# Patient Record
Sex: Female | Born: 1980 | Race: White | Hispanic: No | Marital: Married | Smoking: Never smoker
Health system: Southern US, Community
[De-identification: ages and names within clinical notes are randomized; demographics above are authoritative.]

## PROBLEM LIST (undated history)

## (undated) DIAGNOSIS — G4730 Sleep apnea, unspecified: Secondary | ICD-10-CM

## (undated) DIAGNOSIS — D649 Anemia, unspecified: Secondary | ICD-10-CM

## (undated) DIAGNOSIS — C801 Malignant (primary) neoplasm, unspecified: Secondary | ICD-10-CM

## (undated) DIAGNOSIS — G709 Myoneural disorder, unspecified: Secondary | ICD-10-CM

## (undated) DIAGNOSIS — T7840XA Allergy, unspecified, initial encounter: Secondary | ICD-10-CM

## (undated) DIAGNOSIS — I10 Essential (primary) hypertension: Secondary | ICD-10-CM

## (undated) DIAGNOSIS — M1990 Unspecified osteoarthritis, unspecified site: Secondary | ICD-10-CM

## (undated) DIAGNOSIS — G919 Hydrocephalus, unspecified: Secondary | ICD-10-CM

## (undated) DIAGNOSIS — N83209 Unspecified ovarian cyst, unspecified side: Secondary | ICD-10-CM

## (undated) HISTORY — DX: Sleep apnea, unspecified: G47.30

## (undated) HISTORY — PX: KNEE SURGERY: SHX244

## (undated) HISTORY — DX: Hydrocephalus, unspecified: G91.9

## (undated) HISTORY — PX: BRAIN SURGERY: SHX531

## (undated) HISTORY — DX: Unspecified osteoarthritis, unspecified site: M19.90

## (undated) HISTORY — DX: Myoneural disorder, unspecified: G70.9

## (undated) HISTORY — DX: Malignant (primary) neoplasm, unspecified: C80.1

## (undated) HISTORY — PX: BUNIONECTOMY: SHX129

## (undated) HISTORY — DX: Anemia, unspecified: D64.9

## (undated) HISTORY — DX: Allergy, unspecified, initial encounter: T78.40XA

## (undated) HISTORY — DX: Unspecified ovarian cyst, unspecified side: N83.209

## (undated) HISTORY — DX: Essential (primary) hypertension: I10

---

## 2007-04-05 ENCOUNTER — Encounter: Admission: RE | Admit: 2007-04-05 | Discharge: 2007-04-05 | Payer: Self-pay | Admitting: Neurosurgery

## 2007-05-26 ENCOUNTER — Other Ambulatory Visit: Admission: RE | Admit: 2007-05-26 | Discharge: 2007-05-26 | Payer: Self-pay | Admitting: Obstetrics and Gynecology

## 2010-05-09 ENCOUNTER — Other Ambulatory Visit: Payer: Self-pay | Admitting: Neurosurgery

## 2010-05-09 DIAGNOSIS — R51 Headache: Secondary | ICD-10-CM

## 2010-05-15 ENCOUNTER — Other Ambulatory Visit: Payer: Self-pay | Admitting: Neurosurgery

## 2010-05-15 ENCOUNTER — Ambulatory Visit
Admission: RE | Admit: 2010-05-15 | Discharge: 2010-05-15 | Disposition: A | Discharge status: Home / Self Care | Payer: Managed Care, Other (non HMO) | Source: Ambulatory Visit | Attending: Neurosurgery | Admitting: Neurosurgery

## 2010-05-15 DIAGNOSIS — R531 Weakness: Secondary | ICD-10-CM

## 2010-05-15 DIAGNOSIS — R51 Headache: Secondary | ICD-10-CM

## 2010-05-19 ENCOUNTER — Ambulatory Visit
Admission: RE | Admit: 2010-05-19 | Discharge: 2010-05-19 | Disposition: A | Discharge status: Home / Self Care | Payer: Managed Care, Other (non HMO) | Source: Ambulatory Visit | Attending: Neurosurgery | Admitting: Neurosurgery

## 2010-05-19 DIAGNOSIS — R531 Weakness: Secondary | ICD-10-CM

## 2012-09-06 ENCOUNTER — Other Ambulatory Visit (HOSPITAL_COMMUNITY): Payer: Self-pay | Admitting: Gynecology

## 2012-09-06 DIAGNOSIS — Z3141 Encounter for fertility testing: Secondary | ICD-10-CM

## 2012-09-13 ENCOUNTER — Ambulatory Visit (HOSPITAL_COMMUNITY)
Admission: RE | Admit: 2012-09-13 | Discharge: 2012-09-13 | Disposition: A | Discharge status: Home / Self Care | Payer: BC Managed Care – PPO | Source: Ambulatory Visit | Attending: Gynecology | Admitting: Gynecology

## 2012-09-13 DIAGNOSIS — N979 Female infertility, unspecified: Secondary | ICD-10-CM | POA: Insufficient documentation

## 2012-09-13 DIAGNOSIS — Z3141 Encounter for fertility testing: Secondary | ICD-10-CM

## 2012-09-13 MED ORDER — IOHEXOL 300 MG/ML  SOLN
10.0000 mL | Freq: Once | INTRAMUSCULAR | Status: AC | PRN
  Administered 2012-09-13: 10 mL

## 2020-06-17 ENCOUNTER — Ambulatory Visit (INDEPENDENT_AMBULATORY_CARE_PROVIDER_SITE_OTHER): Payer: No Typology Code available for payment source | Admitting: Nurse Practitioner

## 2020-06-17 ENCOUNTER — Telehealth: Payer: Self-pay | Admitting: *Deleted

## 2020-06-17 ENCOUNTER — Encounter: Payer: Self-pay | Admitting: Nurse Practitioner

## 2020-06-17 ENCOUNTER — Other Ambulatory Visit: Payer: Self-pay

## 2020-06-17 VITALS — BP 118/80 | Ht 65.0 in | Wt 187.0 lb

## 2020-06-17 DIAGNOSIS — M2550 Pain in unspecified joint: Secondary | ICD-10-CM

## 2020-06-17 DIAGNOSIS — R232 Flushing: Secondary | ICD-10-CM

## 2020-06-17 DIAGNOSIS — N949 Unspecified condition associated with female genital organs and menstrual cycle: Secondary | ICD-10-CM

## 2020-06-17 DIAGNOSIS — Z01419 Encounter for gynecological examination (general) (routine) without abnormal findings: Secondary | ICD-10-CM | POA: Diagnosis not present

## 2020-06-17 DIAGNOSIS — Z9889 Other specified postprocedural states: Secondary | ICD-10-CM | POA: Diagnosis not present

## 2020-06-17 DIAGNOSIS — R5383 Other fatigue: Secondary | ICD-10-CM

## 2020-06-17 NOTE — Patient Instructions (Addendum)
Schedule mammogram! [facility] [phone] [address]  [place], [place] [address]   Health Maintenance, Female Adopting a healthy lifestyle and getting preventive care are important in promoting health and wellness. Ask your health care provider about:  The right schedule for you to have regular tests and exams.  Things you can do on your own to prevent diseases and keep yourself healthy. What should I know about diet, weight, and exercise? Eat a healthy diet  Eat a diet that includes plenty of vegetables, fruits, low-fat dairy products, and lean protein.  Do not eat a lot of foods that are high in solid fats, added sugars, or sodium.   Maintain a healthy weight Body mass index (BMI) is used to identify weight problems. It estimates body fat based on height and weight. Your health care provider can help determine your BMI and help you achieve or maintain a healthy weight. Get regular exercise Get regular exercise. This is one of the most important things you can do for your health. Most adults should:  Exercise for at least 150 minutes each week. The exercise should increase your heart rate and make you sweat (moderate-intensity exercise).  Do strengthening exercises at least twice a week. This is in addition to the moderate-intensity exercise.  Spend less time sitting. Even light physical activity can be beneficial. Watch cholesterol and blood lipids Have your blood tested for lipids and cholesterol at 42 years of age, then have this test every 5 years. Have your cholesterol levels checked more often if:  Your lipid or cholesterol levels are high.  You are older than 42 years of age.  You are at high risk for heart disease. What should I know about cancer screening? Depending on your health history and family history, you may need to have cancer screening at various ages. This may include screening for:  Breast cancer.  Cervical  cancer.  Colorectal cancer.  Skin cancer.  Lung cancer. What should I know about heart disease, diabetes, and high blood pressure? Blood pressure and heart disease  High blood pressure causes heart disease and increases the risk of stroke. This is more likely to develop in people who have high blood pressure readings, are of African descent, or are overweight.  Have your blood pressure checked: ? Every 3-5 years if you are 18-39 years of age. ? Every year if you are 40 years old or older. Diabetes Have regular diabetes screenings. This checks your fasting blood sugar level. Have the screening done:  Once every three years after age 40 if you are at a normal weight and have a low risk for diabetes.  More often and at a younger age if you are overweight or have a high risk for diabetes. What should I know about preventing infection? Hepatitis B If you have a higher risk for hepatitis B, you should be screened for this virus. Talk with your health care provider to find out if you are at risk for hepatitis B infection. Hepatitis C Testing is recommended for:  Everyone born from 1945 through 1965.  Anyone with known risk factors for hepatitis C. Sexually transmitted infections (STIs)  Get screened for STIs, including gonorrhea and chlamydia, if: ? You are sexually active and are younger than 42 years of age. ? You are older than 42 years of age and your health care provider tells you that you are at risk for this type of infection. ? Your sexual activity has changed   since you were last screened, and you are at increased risk for chlamydia or gonorrhea. Ask your health care provider if you are at risk.  Ask your health care provider about whether you are at high risk for HIV. Your health care provider may recommend a prescription medicine to help prevent HIV infection. If you choose to take medicine to prevent HIV, you should first get tested for HIV. You should then be tested every 3  months for as long as you are taking the medicine. Pregnancy  If you are about to stop having your period (premenopausal) and you may become pregnant, seek counseling before you get pregnant.  Take 400 to 800 micrograms (mcg) of folic acid every day if you become pregnant.  Ask for birth control (contraception) if you want to prevent pregnancy. Osteoporosis and menopause Osteoporosis is a disease in which the bones lose minerals and strength with aging. This can result in bone fractures. If you are 65 years old or older, or if you are at risk for osteoporosis and fractures, ask your health care provider if you should:  Be screened for bone loss.  Take a calcium or vitamin D supplement to lower your risk of fractures.  Be given hormone replacement therapy (HRT) to treat symptoms of menopause. Follow these instructions at home: Lifestyle  Do not use any products that contain nicotine or tobacco, such as cigarettes, e-cigarettes, and chewing tobacco. If you need help quitting, ask your health care provider.  Do not use street drugs.  Do not share needles.  Ask your health care provider for help if you need support or information about quitting drugs. Alcohol use  Do not drink alcohol if: ? Your health care provider tells you not to drink. ? You are pregnant, may be pregnant, or are planning to become pregnant.  If you drink alcohol: ? Limit how much you use to 0-1 drink a day. ? Limit intake if you are breastfeeding.  Be aware of how much alcohol is in your drink. In the [place] one drink equals one 12 oz bottle of beer (355 mL), one 5 oz glass of wine (148 mL), or one 1 oz glass of hard liquor (44 mL). General instructions  Schedule regular health, dental, and eye exams.  Stay current with your vaccines.  Tell your health care provider if: ? You often feel depressed. ? You have ever been abused or do not feel safe at home. Summary  Adopting a healthy lifestyle and getting  preventive care are important in promoting health and wellness.  Follow your health care provider's instructions about healthy diet, exercising, and getting tested or screened for diseases.  Follow your health care provider's instructions on monitoring your cholesterol and blood pressure. This information is not intended to replace advice given to you by your health care provider. Make sure you discuss any questions you have with your health care provider. Document Revised: 03/02/2018 Document Reviewed: 03/02/2018 Elsevier Patient Education  2021 Elsevier Inc.  

## 2020-06-17 NOTE — Telephone Encounter (Signed)
-----   Message from Olivia Mackie, NP sent at 06/17/2020 10:36 AM EDT ----- Please send rheumatology referral for evaluation of facial flushing, joint pain, and chronic fatigue. She was seen by rheumatology in the past for evaluation of fibromyalgia and Lupus and needs a new one. Thank you.

## 2020-06-17 NOTE — Telephone Encounter (Signed)
Referral placed at [facility] health Rheumatology they will call patient to schedule.

## 2020-06-17 NOTE — Progress Notes (Signed)
Kimberly Rice 09-05-1980 [id number]   History:  40 y.o. G0 presents as new patient to establish care with multiple complaints. She complains of vaginal bump that she noticed a few days ago - no redness, minimal pain, and reports some white drainage with expression. Amenorrheic/endometrial ablation - would like FSH checked as she has been having facial flushing since she was diagnosed with covid months ago. She was seen by rheumatology in the past for possible lupus and fibromyalgia and she would like referral. Father with agent orange exposure.  Large ovarian cysts in the past but unable to complete surgery because of inability to visualize ovaries due to uterus adhering to anterior and posterior walls. History of infertility. Normal pap history.   Gynecologic History No LMP recorded (lmp unknown). Patient has had an ablation.   Contraception/Family planning: none  Health Maintenance Last Pap: 2 years ago per patient. Results were: normal Last mammogram: Never Last colonoscopy: 2 years ago per patient Last Dexa: Never  Past medical history, past surgical history, family history and social history were all reviewed and documented in the EPIC chart.  ROS:  A ROS was performed and pertinent positives and negatives are included.  Exam:  Vitals:   06/17/20 0841  BP: 118/80  Weight: 187 lb (84.8 kg)  Height: 5\' 5"  (1.651 m)   Body mass index is 31.12 kg/m.  General appearance:  Normal Thyroid:  Symmetrical, normal in size, without palpable masses or nodularity. Respiratory  Auscultation:  Clear without wheezing or rhonchi Cardiovascular  Auscultation:  Regular rate, without rubs, murmurs or gallops  Edema/varicosities:  Not grossly evident Abdominal  Soft,nontender, without masses, guarding or rebound.  Liver/spleen:  No organomegaly noted  Hernia:  None appreciated  Skin  Inspection:  Grossly normal   Breasts: Examined lying and sitting.   Right: Without masses,  retractions, discharge or axillary adenopathy.   Left: Without masses, retractions, discharge or axillary adenopathy. Gentitourinary   Inguinal/mons:  Normal without inguinal adenopathy  External genitalia:  2 cm deep cyst, non-infectious appearing, no drainage  BUS/Urethra/Skene's glands:  Normal  Vagina:  Normal  Cervix:  Normal  Uterus:  Normal in size, shape and contour.  Midline and mobile  Adnexa/parametria:     Rt: Without masses or tenderness.   Lt: Without masses or tenderness.  Anus and perineum: Normal  Assessment/Plan:  40 y.o. G0 to establish care.   Well female exam with routine gynecological exam - Education provided on SBEs, importance of preventative screenings, current guidelines, high calcium diet, regular exercise, and multivitamin daily.   Vaginal lump - 2 cm cyst felt at left posterior fourchette. No redness, drainage, pain with palpation, or drainage present. We agree to monitor and if no improvement or if area becomes larger or infectious-appearing she will return for incision and drainage. Recommend warm compresses and avoiding friction, squeezing, or rubbing.   History of endometrial ablation - amenorrheic  Fatigue, unspecified type - complains of chronic fatigue. Was evaluated for fibromyalgia and lupus in the past but needs new rheumatologist.   Arthralgia, unspecified joint - complains of generalized joint pain that she describes as aching. This is not new for her and was seeing rheumatology in the past for evaluation. We will send referral.   Facial flushing - Plan: Follicle stimulating hormone. Has had facial flushing, R>L that is constant and started after covid diagnosis months ago. She would like FSH checked today to rule out menopause. Symptoms characteristic of lupus rash.  Screening for cervical  cancer - Normal Pap history. Pap today.   Screening for breast cancer - Has not had screening mammogram. Discussed current guidelines and importance of  preventative screenings. Information provided on the [facility].  Normal breast exam today.  Screening for colon cancer - Colonoscopy around 2 years ago. Will repeat at GI's recommended interval.   Return in 1 year for annual.    Olivia Mackie DNP, 9:12 AM 06/17/2020

## 2020-06-18 LAB — FOLLICLE STIMULATING HORMONE: FSH: 3.9 m[IU]/mL

## 2020-06-18 NOTE — Telephone Encounter (Signed)
Megan from Rheumatology office sent referral message stating "Hi- prior to reviewing the referral we will need all previous rheumatology records. Thanks!"  I called and informed patient.

## 2020-06-25 NOTE — Telephone Encounter (Signed)
I called patient to see if she has received those records, I am not able to get the records for her.

## 2020-06-26 ENCOUNTER — Encounter: Payer: Self-pay | Admitting: Nurse Practitioner

## 2020-06-26 LAB — PAP, TP IMAGING W/ HPV RNA, RFLX HPV TYPE 16,18/45: HPV DNA High Risk: DETECTED — AB

## 2020-06-26 LAB — HPV TYPE 16 AND 18/45 RNA
HPV Type 16 RNA: NOT DETECTED
HPV Type 18/45 RNA: NOT DETECTED

## 2020-06-27 NOTE — Telephone Encounter (Signed)
Patient informed with below, I provided her with name to [facility] health Rheumatology [phone] so records can be released to them.

## 2020-07-30 NOTE — Telephone Encounter (Signed)
Referral has been closed by Rheumatology patient has not sent records from previous MD.

## 2020-10-03 ENCOUNTER — Other Ambulatory Visit: Payer: Self-pay | Admitting: Rheumatology

## 2020-10-03 DIAGNOSIS — M549 Dorsalgia, unspecified: Secondary | ICD-10-CM

## 2020-10-14 ENCOUNTER — Ambulatory Visit (INDEPENDENT_AMBULATORY_CARE_PROVIDER_SITE_OTHER): Payer: No Typology Code available for payment source | Admitting: Allergy and Immunology

## 2020-10-14 ENCOUNTER — Other Ambulatory Visit: Payer: Self-pay

## 2020-10-14 ENCOUNTER — Encounter: Payer: Self-pay | Admitting: Allergy and Immunology

## 2020-10-14 VITALS — BP 166/98 | HR 77 | Resp 16 | Ht 65.0 in | Wt 196.2 lb

## 2020-10-14 DIAGNOSIS — R232 Flushing: Secondary | ICD-10-CM | POA: Diagnosis not present

## 2020-10-14 DIAGNOSIS — J4520 Mild intermittent asthma, uncomplicated: Secondary | ICD-10-CM

## 2020-10-14 DIAGNOSIS — K219 Gastro-esophageal reflux disease without esophagitis: Secondary | ICD-10-CM | POA: Diagnosis not present

## 2020-10-14 DIAGNOSIS — J3089 Other allergic rhinitis: Secondary | ICD-10-CM | POA: Diagnosis not present

## 2020-10-14 DIAGNOSIS — T781XXD Other adverse food reactions, not elsewhere classified, subsequent encounter: Secondary | ICD-10-CM

## 2020-10-14 MED ORDER — LORATADINE 10 MG PO TABS: ORAL_TABLET | ORAL | 5 refills | Status: DC

## 2020-10-14 MED ORDER — EPINEPHRINE 0.3 MG/0.3ML IJ SOAJ: 0.3000 mg | INTRAMUSCULAR | 1 refills | Status: DC | PRN

## 2020-10-14 MED ORDER — FAMOTIDINE 40 MG PO TABS: 40.0000 mg | ORAL_TABLET | Freq: Every day | ORAL | 5 refills | Status: DC

## 2020-10-14 NOTE — Patient Instructions (Addendum)
  1.  Allergen avoidance measures - dust mite  2.  Daily antihistamine (H1RB): Loratadine 10 mg - 1-2 tablets 1 time per day  3.  Daily antihistamine (H2RB): Famotidine 40 mg - 1 tablet 1 time per day  4. If needed:  A. Albuterol HFA - 2 inhalations every 4-6 hours B. Auvi-Q 0.3, benadryl, MD/ER evaluation for allergic reaction  5. Evaluation for flushing:  A. Blood - Tryptase B. 24 hour urine - methyl histamine, 5-HIAA  6. Review food blood tests  7. Return to clinic in 4 weeks or earlier if problem

## 2020-10-14 NOTE — Progress Notes (Signed)
[place] - [place] - [place] - [place] - [place]   Dear Dr. Mathis Bud,  Thank you for referring Kimberly Rice to the [facility] [place] on 10/14/2020.   Below is a summation of this patient's evaluation and recommendations.  Thank you for your referral. I will keep you informed about this patient's response to treatment.   If you have any questions please do not hesitate to contact me.   Sincerely,  Jessica Priest, MD Allergy / Immunology [facility]   ______________________________________________________________________    NEW PATIENT NOTE  Referring Provider: Lucianne Lei, MD Primary Provider: Lucianne Lei, MD Date of office visit: 10/14/2020    Subjective:   Chief Complaint:  Kimberly Rice (DOB: [birth year]) is a 40 y.o. female who presents to the clinic on 10/14/2020 with a chief complaint of Allergic Reaction and Allergic Rhinitis  .     HPI: Shabre presents to this clinic in evaluation of multiple issues.  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  This appears to occur multiple times per day.  She has very big flushing when she eats spicy foods such as black pepper and white pepper and may be paprika.  Heat exposure also appears to precipitate this issue.This issues been going on for decades but has been much worse recently.  Apparently she has had some blood test performed which identified multiple food sensitivities.  Second, apparently she believes that she may be allergic to eggs.  She was on a keto diet and was eating omelettes on a daily basis in 2020 she started to develop this chest stabbing / breathing problem when she discontinued her egg consumption and it took 2 months for this to resolve.  Then she ate an omelette and she had the exact same symptoms return within minutes which lasted a few days.  Since that  point in time she has not eaten any omelettes but does eat baked goods with eggs with without any problem.  She can eat duck eggs with no problem.  Third, she has "cold symptoms" with coughing and nasal congestion and some slight sneezing and some occasional wheezing and she has been given an inhaler in the past.  This appears to present itself during the spring and the winter.  There does not appear to be an obvious provoking factor giving rise to that issue.  Fourth, she has noticed that she has developed significant problems with reflux over the past several months.  She did have a history of reflux in the past but it was relatively inactive for several years.  She has burning and regurgitation.  She does not consume any caffeine or eat any chocolate.  Fifth, she has been having arthralgia and visited with a rheumatologist in [place] for this issue and had a bunch of blood test performed which apparently did not identify any connective tissue disease but she is being evaluated for a spondyloarthropathy and will be having an MRI of her spine this week.  Sixth, she has rather significant fatigue which has been a problem for decades.  She does use a CPAP machine on a nightly basis.  Her fatigue has been much worse since COVID.  She has had an episode of COVID in February 2022 and June 2022.  Past Medical History:  Diagnosis Date   Hydrocephalus (HCC)    Ovarian cyst  Past Surgical History:  Procedure Laterality Date   BUNIONECTOMY     ENDOMETRIAL ABLATION W/ NOVASURE  06/07/2017   KNEE SURGERY     LEFT OOPHORECTOMY  11/24/2016   Cyst    Allergies as of 10/14/2020       Reactions   Wound Dressing Adhesive         Medication List   None  Review of systems negative except as noted in HPI / PMHx or noted below:  Review of Systems  Constitutional: Negative.   HENT: Negative.    Eyes: Negative.   Respiratory: Negative.    Cardiovascular: Negative.   Gastrointestinal:  Negative.   Genitourinary: Negative.   Musculoskeletal: Negative.   Skin: Negative.   Neurological: Negative.   Endo/Heme/Allergies: Negative.   Psychiatric/Behavioral: Negative.     Family History  Problem Relation Age of Onset   Diabetes Mother    Hypertension Mother    Prostate cancer Father    Heart attack Maternal Grandfather     Social History   Socioeconomic History   Marital status: Married    Spouse name: Not on file   Number of children: Not on file   Years of education: Not on file   Highest education level: Not on file  Occupational History   Not on file  Tobacco Use   Smoking status: Never   Smokeless tobacco: Never  Substance and Sexual Activity   Alcohol use: Never   Drug use: Never   Sexual activity: Yes    Birth control/protection: None  Other Topics Concern   Not on file  Social History Narrative   Not on file   Environmental and Social history  Lives in a house with a dry environment, no animals located inside the household, no carpet in the bedroom, no plastic on the bed, no plastic on the pillow, and no smoking ongoing with inside the household.  Objective:   Vitals:   10/14/20 0930 10/14/20 1001  BP: (!) 170/120 (!) 166/98  Pulse: 77   Resp: 16   SpO2: 98%    Height: 5\' 5"  (165.1 cm) Weight: 196 lb 3.2 oz (89 kg)  Physical Exam Constitutional:      Appearance: She is not diaphoretic.  HENT:     Head: Normocephalic.     Right Ear: Tympanic membrane, ear canal and external ear normal.     Left Ear: Tympanic membrane, ear canal and external ear normal.     Nose: Nose normal. No mucosal edema or rhinorrhea.     Mouth/Throat:     Pharynx: Uvula midline. No oropharyngeal exudate.  Eyes:     Conjunctiva/sclera: Conjunctivae normal.  Neck:     Thyroid: No thyromegaly.     Trachea: Trachea normal. No tracheal tenderness or tracheal deviation.  Cardiovascular:     Rate and Rhythm: Normal rate and regular rhythm.     Heart sounds:  Normal heart sounds, S1 normal and S2 normal. No murmur heard. Pulmonary:     Effort: No respiratory distress.     Breath sounds: Normal breath sounds. No stridor. No wheezing or rales.  Lymphadenopathy:     Head:     Right side of head: No tonsillar adenopathy.     Left side of head: No tonsillar adenopathy.     Cervical: No cervical adenopathy.  Skin:    Findings: Rash (Slight erythematous hue face) present. No erythema.     Nails: There is no clubbing.  Neurological:     Mental  Status: She is alert.    Diagnostics: Allergy skin tests were performed.  She demonstrated hypersensitivity to house dust mite  Spirometry was performed and demonstrated an FEV1 of 2.89 @ 94 % of predicted. FEV1/FVC = 0.87  Assessment and Plan:    1. Flushing   2. Perennial allergic rhinitis   3. Asthma, mild intermittent, well-controlled   4. Adverse food reaction, subsequent encounter   5. Gastroesophageal reflux disease, unspecified whether esophagitis present     1.  Allergen avoidance measures -dust mite  2.  Daily antihistamine (H1RB): Loratadine 10 mg - 1-2 tablets 1 time per day  3.  Daily antihistamine (H2RB): Famotidine 40 mg - 1 tablet 1 time per day  4. If needed:  A. Albuterol HFA - 2 inhalations every 4-6 hours B. Auvi-Q 0.3, benadryl, MD/ER evaluation for allergic reaction  5. Evaluation for flushing:  A. Blood - Tryptase B. 24 hour urine - methyl histamine, 5-HIAA  6. Review food blood tests  7. Return to clinic in 4 weeks or earlier if problem  Dhrithi has some form of Flushing disorder with unknown etiologic agents, and we are going to rule out mastocytosis and carcinoid contributing to this issue.  I suspect that she has autonomic dysfunction that is giving rise to most of her flushing disorder Rinda could also be a component of rosacea.  We will treat her with an H1 and H2 receptor blocker to see if we can blunt some of this reaction.  She has some degree of atopic disease  giving rise to some respiratory tract issues and she can perform dust mite avoidance measures.  She may or may not have true food sensitivity but based on today skin test she did not demonstrate any hypersensitivity against a broad spectrum of foods including egg.  It appears that the blood test that identified sensitivity to foods were all IgG blood tests and not IgE blood tests.  I will see her back in this clinic in 4 weeks or earlier if there is a problem.  Jessica Priest, MD Allergy / Immunology [facility] [place]

## 2020-10-15 ENCOUNTER — Telehealth: Payer: Self-pay | Admitting: Allergy and Immunology

## 2020-10-15 ENCOUNTER — Encounter: Payer: Self-pay | Admitting: *Deleted

## 2020-10-15 ENCOUNTER — Encounter: Payer: Self-pay | Admitting: Allergy and Immunology

## 2020-10-15 MED ORDER — ALBUTEROL SULFATE HFA 108 (90 BASE) MCG/ACT IN AERS: INHALATION_SPRAY | RESPIRATORY_TRACT | 1 refills | Status: DC

## 2020-10-15 NOTE — Telephone Encounter (Signed)
Patient states she forgot to tell Dr. Lucie Leather at her visit yesterday (7/25) that she also experiences tingling and numbness around her mouth and lips. This happens randomly but multiple times a day, as well as in her hands and feet.   She is requesting Albuterol to be sent in to her pharmacy.   [facility] [address]  [place], [place] [address]

## 2020-10-15 NOTE — Telephone Encounter (Signed)
Dr. Lucie Leather, please see additional information from patient in previous message.  Note:  Albuterol HFA has been sent to [facility] as requested and I left detailed message on voicemail informing patient that it has been sent.

## 2020-10-16 NOTE — Telephone Encounter (Signed)
Left message to call the office.  Per Dr. Lucie Leather, please disregard his statement about nebulized albuterol.  There was a miscommunication among the staff; patient already has Albuterol HFA.  Please inform patient of the rest of the message from Dr. Lucie Leather.

## 2020-10-16 NOTE — Telephone Encounter (Signed)
Please provide Kimberly Rice with nebulized albuterol should it be required.  Usually tingling around the mouth and face and occasionally hands is associated with hyperventilation syndrome which means that she is overbreathing and losing too much carbon dioxide as she breathes.  Make sure that she discusses this issue when she returns to this clinic for RV.

## 2020-10-17 ENCOUNTER — Ambulatory Visit
Admission: RE | Admit: 2020-10-17 | Discharge: 2020-10-17 | Disposition: A | Discharge status: Home / Self Care | Payer: PRIVATE HEALTH INSURANCE | Source: Ambulatory Visit | Attending: Rheumatology | Admitting: Rheumatology

## 2020-10-17 ENCOUNTER — Other Ambulatory Visit: Payer: Self-pay

## 2020-10-17 DIAGNOSIS — M549 Dorsalgia, unspecified: Secondary | ICD-10-CM

## 2020-10-17 NOTE — Telephone Encounter (Signed)
Odis informed of Dr. Kathyrn Lass message. She will discuss at her OV.

## 2020-10-22 LAB — HISTAMINE, 24 HOUR URINE
Histamine,ug/24hr,U: 407 ug/24 hr — ABNORMAL HIGH (ref 0–65)
Histamine,ug/L,U: 166 ug/L

## 2020-10-22 LAB — TRYPTASE: Tryptase: 3.9 ug/L (ref 2.2–13.2)

## 2020-10-23 ENCOUNTER — Telehealth: Payer: Self-pay | Admitting: Allergy and Immunology

## 2020-10-23 NOTE — Telephone Encounter (Signed)
Spoke with Kimberly Rice

## 2020-10-23 NOTE — Telephone Encounter (Signed)
Patient returning call on lab work [phone]

## 2020-10-24 LAB — 5 HIAA, QUANTITATIVE, URINE, 24 HOUR
5-HIAA, Ur: 2.8 mg/L
5-HIAA,Quant.,24 Hr Urine: 6.9 mg/24 hr (ref 0.0–14.9)

## 2020-10-30 NOTE — Telephone Encounter (Signed)
Patient called the office regarding the rest of her lab results from 7/28. Informed patient that a nurse will be in touch with her as soon as Dr. Lucie Leather reviews the labs.

## 2020-10-31 NOTE — Telephone Encounter (Signed)
Mychart message has been sent.  

## 2020-11-06 ENCOUNTER — Ambulatory Visit: Payer: No Typology Code available for payment source | Admitting: Allergy and Immunology

## 2020-11-06 ENCOUNTER — Encounter: Payer: Self-pay | Admitting: Allergy and Immunology

## 2020-11-06 ENCOUNTER — Other Ambulatory Visit: Payer: Self-pay

## 2020-11-06 VITALS — BP 148/84 | HR 66 | Resp 20

## 2020-11-06 DIAGNOSIS — G629 Polyneuropathy, unspecified: Secondary | ICD-10-CM

## 2020-11-06 DIAGNOSIS — D8940 Mast cell activation, unspecified: Secondary | ICD-10-CM

## 2020-11-06 DIAGNOSIS — R232 Flushing: Secondary | ICD-10-CM

## 2020-11-06 DIAGNOSIS — J3089 Other allergic rhinitis: Secondary | ICD-10-CM

## 2020-11-06 DIAGNOSIS — F959 Tic disorder, unspecified: Secondary | ICD-10-CM

## 2020-11-06 DIAGNOSIS — J4520 Mild intermittent asthma, uncomplicated: Secondary | ICD-10-CM

## 2020-11-06 MED ORDER — FAMOTIDINE 40 MG PO TABS: 40.0000 mg | ORAL_TABLET | Freq: Two times a day (BID) | ORAL | 5 refills | Status: DC

## 2020-11-06 MED ORDER — LORATADINE 10 MG PO TABS: ORAL_TABLET | ORAL | 5 refills | Status: DC

## 2020-11-06 NOTE — Progress Notes (Signed)
[place] - [place] - [place] - [place] - [place]   Follow-up Note  Referring Provider: Lucianne Lei, MD Primary Provider: Lucianne Lei, MD Date of Office Visit: 11/06/2020  Subjective:   Kimberly Rice (DOB: Aug 08, 1980) is a 40 y.o. female who [facility]2020 in re-evaluation of the following:  HPI: Kimberly Rice returns to this clinic in evaluation of flushing, allergic rhinitis, asthma, adverse food reaction, and reflux.  I last saw Kimberly Rice in this clinic on 14 October 2020 for Kimberly Rice initial evaluation at which point in time we addressed these issues.  Kimberly Rice has significantly improved regarding Kimberly Rice flushing.  Kimberly Rice face is not as warm and Kimberly Rice is not having these big episodes of outbreaks on Kimberly Rice face and chest.  Kimberly Rice has been using Kimberly Rice loratadine at 20 mg daily and Kimberly Rice famotidine at 40 mg daily.  Kimberly Rice reflux is actually doing a lot better at this point in time.  Kimberly Rice is had very little issues with Kimberly Rice airway.  Kimberly Rice does not use a short acting bronchodilator.  Kimberly Rice is eating eggs with no problem.  Kimberly Rice did visit with a rheumatologist regarding possible spondyloarthropathy and Kimberly Rice was told that Kimberly Rice does not have that issue and Kimberly Rice has 2 bulging disks in Kimberly Rice back.  Kimberly Rice has a few other complaints.  Kimberly Rice has been having this tic disorder for a while and this has become much more severe involving Kimberly Rice upper arms.  As well, Kimberly Rice arms feel very fatigued and they ache at night.  And Kimberly Rice has had a long history of having Kimberly Rice lower extremities from Kimberly Rice knees on down through Kimberly Rice feet just feeling very heavy and achy.  And Kimberly Rice has been having some tingling or "fuzzy" feeling of Kimberly Rice lips which has been a relatively new issue for the past several months.  Allergies as of 11/06/2020       Reactions   Wound Dressing Adhesive         Medication List    albuterol 108 (90 Base) MCG/ACT inhaler Commonly known as: VENTOLIN HFA Can inhale two puffs every four to six hours as  needed for cough or wheeze.   EPINEPHrine 0.3 mg/0.3 mL Soaj injection Commonly known as: Auvi-Q Inject 0.3 mg into the muscle as needed for anaphylaxis. As directed for life-threatening allergic reactions   famotidine 40 MG tablet Commonly known as: PEPCID Take 1 tablet (40 mg total) by mouth daily.   loratadine 10 MG tablet Commonly known as: CLARITIN Take 1-2 tablets 1 time per day    Past Medical History:  Diagnosis Date   Hydrocephalus (HCC)    Ovarian cyst     Past Surgical History:  Procedure Laterality Date   BUNIONECTOMY     ENDOMETRIAL ABLATION W/ NOVASURE  06/07/2017   KNEE SURGERY     LEFT OOPHORECTOMY  11/24/2016   Cyst    Review of systems negative except as noted in HPI / PMHx or noted below:  Review of Systems  Constitutional: Negative.   HENT: Negative.    Eyes: Negative.   Respiratory: Negative.    Cardiovascular: Negative.   Gastrointestinal: Negative.   Genitourinary: Negative.   Musculoskeletal: Negative.   Skin: Negative.   Neurological: Negative.   Endo/Heme/Allergies: Negative.   Psychiatric/Behavioral: Negative.      Objective:   Vitals:   11/06/20 1515  BP: (!) 148/84  Pulse: 66  Resp: 20  SpO2: 99%  Physical Exam Constitutional:      Appearance: Kimberly Rice is not diaphoretic.  HENT:     Head: Normocephalic.     Right Ear: Tympanic membrane, ear canal and external ear normal.     Left Ear: Tympanic membrane, ear canal and external ear normal.     Nose: Nose normal. No mucosal edema or rhinorrhea.     Mouth/Throat:     Pharynx: Uvula midline. No oropharyngeal exudate.  Eyes:     Conjunctiva/sclera: Conjunctivae normal.  Neck:     Thyroid: No thyromegaly.     Trachea: Trachea normal. No tracheal tenderness or tracheal deviation.  Cardiovascular:     Rate and Rhythm: Normal rate and regular rhythm.     Heart sounds: Normal heart sounds, S1 normal and S2 normal. No murmur heard. Pulmonary:     Effort: No respiratory  distress.     Breath sounds: Normal breath sounds. No stridor. No wheezing or rales.  Lymphadenopathy:     Head:     Right side of head: No tonsillar adenopathy.     Left side of head: No tonsillar adenopathy.     Cervical: No cervical adenopathy.  Skin:    Findings: No erythema or rash.     Nails: There is no clubbing.  Neurological:     Mental Status: Kimberly Rice is alert.    Diagnostics:   Results of blood test obtained 17 October 2020 identified tryptase 3.9 UG/L.  Results of 24-hour urine test obtained 17 October 2020 identified histamine 407 mcg / 24 hours, 5-HIAA 6.9 mg / 24 hours  Assessment and Plan:   1. Mast cell activation (HCC)   2. Flushing   3. Perennial allergic rhinitis   4. Asthma, mild intermittent, well-controlled   5. Neuropathy   6. Tic disorder     1.  Allergen avoidance measures - dust mite  2.  Daily antihistamine (H1RB): Loratadine 10 mg - 2 tablet 2 times per day  3.  Daily antihistamine (H2RB): Famotidine 40 mg - 1 tablet 2 times per day  4. If needed:  A. Albuterol HFA - 2 inhalations every 4-6 hours B. Auvi-Q 0.3, benadryl, MD/ER evaluation for allergic reaction  5. Visit with neurologist for neuropathy / Tic disorder  6. Return to clinic in December 2022 or earlier if problem  Kimberly Rice does indeed appear to have some form of mast cell activation with increased histamine production as documented by Kimberly Rice 24-hour urinary histamine collection.  At this point in time I do not think Kimberly Rice needs to undergo any further evaluation for mastocytosis as its not really going to change Kimberly Rice approach at this point in time.  Kimberly Rice will utilize a collection of H1 receptor blocker and H2 receptor blocker which has helped Kimberly Rice significantly and we will see what happens as we increase the dose of these medications.  If for some reason Kimberly Rice does demonstrate evidence of progressive mast cell activation or dysregulation then we will probably need to obtain a bone marrow to exclude  systemic mastocytosis.  Kimberly Rice other issues are doing relatively well especially Kimberly Rice respiratory tract issue.  Kimberly Rice does appear to have a tic disorder and a possible neuropathy and we will refer Kimberly Rice onto neurology for further evaluation of this issue.  [name], MD Allergy / Immunology [facility]

## 2020-11-06 NOTE — Patient Instructions (Addendum)
  1.  Allergen avoidance measures - dust mite  2.  Daily antihistamine (H1RB): Loratadine 10 mg - 2 tablet 2 times per day  3.  Daily antihistamine (H2RB): Famotidine 40 mg - 1 tablet 2 times per day  4. If needed:  A. Albuterol HFA - 2 inhalations every 4-6 hours B. Auvi-Q 0.3, benadryl, MD/ER evaluation for allergic reaction  5. Visit with neurologist for neuropathy / Tic disorder  6. Return to clinic in December 2022 or earlier if problem

## 2020-11-07 ENCOUNTER — Encounter: Payer: Self-pay | Admitting: Allergy and Immunology

## 2020-11-13 ENCOUNTER — Ambulatory Visit: Payer: No Typology Code available for payment source | Admitting: Allergy and Immunology

## 2020-12-23 ENCOUNTER — Other Ambulatory Visit: Payer: Self-pay

## 2020-12-23 ENCOUNTER — Ambulatory Visit (INDEPENDENT_AMBULATORY_CARE_PROVIDER_SITE_OTHER): Payer: No Typology Code available for payment source | Admitting: Allergy and Immunology

## 2020-12-23 ENCOUNTER — Encounter: Payer: Self-pay | Admitting: Allergy and Immunology

## 2020-12-23 VITALS — BP 158/100 | HR 92 | Resp 18

## 2020-12-23 DIAGNOSIS — R232 Flushing: Secondary | ICD-10-CM

## 2020-12-23 DIAGNOSIS — J4520 Mild intermittent asthma, uncomplicated: Secondary | ICD-10-CM

## 2020-12-23 DIAGNOSIS — J3089 Other allergic rhinitis: Secondary | ICD-10-CM | POA: Diagnosis not present

## 2020-12-23 DIAGNOSIS — D8940 Mast cell activation, unspecified: Secondary | ICD-10-CM | POA: Diagnosis not present

## 2020-12-23 DIAGNOSIS — K219 Gastro-esophageal reflux disease without esophagitis: Secondary | ICD-10-CM

## 2020-12-23 MED ORDER — ASPIRIN EC 325 MG PO TBEC: 325.0000 mg | DELAYED_RELEASE_TABLET | Freq: Every day | ORAL | 2 refills | Status: DC

## 2020-12-23 MED ORDER — METRONIDAZOLE 0.75 % EX CREA: TOPICAL_CREAM | CUTANEOUS | 3 refills | Status: DC

## 2020-12-23 NOTE — Patient Instructions (Addendum)
  1.  Allergen avoidance measures - dust mite  2.  Daily antihistamine (H1RB): Loratadine 10 mg - 2 tablet 2 times per day  3.  Daily antihistamine (H2RB): Famotidine 40 mg - 1 tablet 2 times per day  4. Start ASA 325 - 1 tablet 1 time per day. Call clinic in 2 weeks  5. Start Metrocream - apply to face 2 times per day  6. If needed:  A. Albuterol HFA - 2 inhalations every 4-6 hours B. Auvi-Q 0.3, benadryl, MD/ER evaluation for allergic reaction C. Nasal steroid -Rhinocort/Nasacort/Flonase   7. Return to clinic in December 2022 or earlier if problem

## 2020-12-23 NOTE — Progress Notes (Signed)
[place] - [place] - [place] - [place] - [place]   Follow-up Note  Referring Provider: Lucianne Lei, MD Primary Provider: Lucianne Lei, MD Date of Office Visit: 12/23/2020  Subjective:   Kimberly Rice (DOB: September 03, 1980) is a 40 y.o. female who [facility] 12/23/2020 in re-evaluation of the following:  HPI: Kimberly Rice returns to this clinic in evaluation of mast cell activation syndrome defined by a elevated urinary histamine level and a flushing disorder, history of asthma, history of allergic rhinitis..  I last saw her in this clinic on 06 November 2020.  Although she is doing better with a combination of an H1 and H2 receptor blocker regarding her flushing of her face and body she still continues to have this issue.  She has been exploring the Internet and feels as though she may require ketotifen.  She was given some hydroxyzine by her primary care doctor and maybe this helped somewhat.  Her reflux is not a problem at this point.  She has had no issues with her airway but occasionally she does get nasal congestion with sometimes can interfere with her treatment for CPAP.Marland Kitchen  She did have a tic disorder when last seen in this clinic and she went to see a neurologist who gave her Topamax but it caused side effects.  She still continues with her tic disorder.  As well, she is having some pain emanating from her right jaw that goes across her mandible and maxilla on occasion for which she saw a dentist who performed x-rays and did not see any significant abnormality and she is going to visit with a neurologist in [place] when she returns to that state in the next month or so.  She does not receive the flu vaccine.  Allergies as of 12/23/2020       Reactions   Wound Dressing Adhesive         Medication List    albuterol 108 (90 Base) MCG/ACT inhaler Commonly known as: VENTOLIN HFA Can inhale two puffs every four to six hours as needed for cough or  wheeze.   EPINEPHrine 0.3 mg/0.3 mL Soaj injection Commonly known as: Auvi-Q Inject 0.3 mg into the muscle as needed for anaphylaxis. As directed for life-threatening allergic reactions   famotidine 40 MG tablet Commonly known as: PEPCID Take 1 tablet (40 mg total) by mouth 2 (two) times daily.   hydrOXYzine 25 MG tablet Commonly known as: ATARAX/VISTARIL Take 25 mg by mouth daily.   loratadine 10 MG tablet Commonly known as: CLARITIN Take 2 tablets twice a day    Past Medical History:  Diagnosis Date   Hydrocephalus (HCC)    Ovarian cyst     Past Surgical History:  Procedure Laterality Date   BUNIONECTOMY     ENDOMETRIAL ABLATION W/ NOVASURE  06/07/2017   KNEE SURGERY     LEFT OOPHORECTOMY  11/24/2016   Cyst    Review of systems negative except as noted in HPI / PMHx or noted below:  Review of Systems  Constitutional: Negative.   HENT: Negative.    Eyes: Negative.   Respiratory: Negative.    Cardiovascular: Negative.   Gastrointestinal: Negative.   Genitourinary: Negative.   Musculoskeletal: Negative.   Skin: Negative.   Neurological: Negative.   Endo/Heme/Allergies: Negative.   Psychiatric/Behavioral: Negative.      Objective:   Vitals:   12/23/20 1113  BP: (!) 158/100  Pulse: 92  Resp: 18  SpO2: 98%  Physical Exam Constitutional:      Appearance: She is not diaphoretic.  HENT:     Head: Normocephalic.     Right Ear: Tympanic membrane, ear canal and external ear normal.     Left Ear: Tympanic membrane, ear canal and external ear normal.     Nose: Nose normal. No mucosal edema or rhinorrhea.     Mouth/Throat:     Pharynx: Uvula midline. No oropharyngeal exudate.  Eyes:     Conjunctiva/sclera: Conjunctivae normal.  Neck:     Thyroid: No thyromegaly.     Trachea: Trachea normal. No tracheal tenderness or tracheal deviation.  Cardiovascular:     Rate and Rhythm: Normal rate and regular rhythm.     Heart sounds: Normal heart sounds,  S1 normal and S2 normal. No murmur heard. Pulmonary:     Effort: No respiratory distress.     Breath sounds: Normal breath sounds. No stridor. No wheezing or rales.  Lymphadenopathy:     Head:     Right side of head: No tonsillar adenopathy.     Left side of head: No tonsillar adenopathy.     Cervical: No cervical adenopathy.  Skin:    Findings: Rash (Facial erythema mostly cheeks central forehead) present. No erythema.     Nails: There is no clubbing.  Neurological:     Mental Status: She is alert.    Diagnostics: none  Assessment and Plan:   1. Mast cell activation (HCC)   2. Flushing   3. Perennial allergic rhinitis   4. Asthma, mild intermittent, well-controlled   5. Gastroesophageal reflux disease, unspecified whether esophagitis present     1.  Allergen avoidance measures - dust mite  2.  Daily antihistamine (H1RB): Loratadine 10 mg - 2 tablet 2 times per day  3.  Daily antihistamine (H2RB): Famotidine 40 mg - 1 tablet 2 times per day  4. Start ASA 325 - 1 tablet 1 time per day. Call clinic in 2 weeks  5. Start Metrocream - apply to face 2 times per day  6. If needed:  A. Albuterol HFA - 2 inhalations every 4-6 hours B. Auvi-Q 0.3, benadryl, MD/ER evaluation for allergic reaction C. Nasal steroid -Rhinocort/Nasacort/Flonase   7. Return to clinic in December 2022 or earlier if problem  Kimberly Rice will start aspirin in an attempt to decrease prostaglandin production from her mast cells and will remain on an H1 and H2 receptor blocker and we will see what type of effect we get this approach regarding her mast cell activating syndrome.  If she does not have a good response to this approach then we will try to blocker leukotrienes with a leukotrienes modifier.  I have given her some MetroCream to a apply on her face as she does appear to have some significant inflammation of the blood vessels of her face.  She will continue on all other therapy as noted above.  I will see her  back in this clinic in December 2022 or earlier if there is a problem.  Kimberly Schimke, MD Allergy / Immunology [facility]

## 2020-12-24 ENCOUNTER — Encounter: Payer: Self-pay | Admitting: Allergy and Immunology

## 2021-01-06 ENCOUNTER — Telehealth: Payer: Self-pay | Admitting: Allergy and Immunology

## 2021-01-06 NOTE — Telephone Encounter (Signed)
Patient called the office to give a 2 week follow up as she's been taking aspirin and using the metrocream. She states she stopped using the cream because it would make her face hurt. She is still having issues everyday and is breaking out in hives every morning. Patient states when she started her period, her left eye started to hurt and swell.

## 2021-01-07 MED ORDER — MONTELUKAST SODIUM 10 MG PO TABS: 10.0000 mg | ORAL_TABLET | Freq: Every day | ORAL | 5 refills | Status: DC

## 2021-01-07 NOTE — Telephone Encounter (Signed)
Inform Kimberly Rice that foremost she should not ever touch or rub her eye.  She can use hot or cold compresses if needed.  She should use lubricating eyedrops such as Systane several times per day.  If her eye remains swollen she will certainly require further evaluation.  Does she have a ophthalmologist or optometrist that she could see this week if her eye continues to be a problem?

## 2021-01-07 NOTE — Telephone Encounter (Signed)
Patient was informed and verbalized understanding.

## 2021-01-07 NOTE — Telephone Encounter (Signed)
Lets have her stop the aspirin and start montelukast 10 mg daily.

## 2021-01-07 NOTE — Telephone Encounter (Signed)
Prescription for montelukast was sent in to pharmacy and patient was informed.  Patient states her left eye is still swollen and it hurts. She would like to know if there is anything we could send in for her like an eye drop. Please advice. Thank you

## 2021-01-21 ENCOUNTER — Telehealth: Payer: Self-pay | Admitting: Allergy and Immunology

## 2021-01-21 NOTE — Telephone Encounter (Signed)
Please advise 

## 2021-01-21 NOTE — Telephone Encounter (Signed)
Patient states none of her medications are working. She's been intermittent fasting but she's still taking her medications 2 times a day. She has a rash 24/7 and states when she flares up her face gets VERY HOT as if its on fire. She is still experiencing the numbness and tingling in her hands. When she eats she is also developing stomach pain.

## 2021-01-22 ENCOUNTER — Other Ambulatory Visit: Payer: Self-pay | Admitting: *Deleted

## 2021-01-22 DIAGNOSIS — R232 Flushing: Secondary | ICD-10-CM

## 2021-01-22 DIAGNOSIS — D8940 Mast cell activation, unspecified: Secondary | ICD-10-CM

## 2021-01-22 MED ORDER — HYDROXYZINE HCL 10 MG PO TABS: ORAL_TABLET | ORAL | 0 refills | Status: DC

## 2021-01-22 NOTE — Telephone Encounter (Signed)
RX sent to pharmacy  

## 2021-01-22 NOTE — Telephone Encounter (Signed)
Left a message for patient to call back. We will just need to see which location she prefers, [facility] or [place]. Also, we will let her know the reason for the referral is because these facilities are able to do more extensive testing and prescribing than we can.

## 2021-01-22 NOTE — Telephone Encounter (Signed)
Spoke with Kimberly Rice and will send a referral to [facility].   She is requesting a prescription for Hydroxyzine to help hold her over until her appt. She states that she does not take it everyday, only at night when she cannot sleep because of her symptoms.

## 2022-06-08 ENCOUNTER — Ambulatory Visit (INDEPENDENT_AMBULATORY_CARE_PROVIDER_SITE_OTHER): Payer: No Typology Code available for payment source | Admitting: Nurse Practitioner

## 2022-06-08 ENCOUNTER — Encounter: Payer: Self-pay | Admitting: Nurse Practitioner

## 2022-06-08 VITALS — BP 136/82 | HR 87 | Ht 62.0 in | Wt 204.0 lb

## 2022-06-08 DIAGNOSIS — N644 Mastodynia: Secondary | ICD-10-CM | POA: Diagnosis not present

## 2022-06-08 NOTE — Progress Notes (Addendum)
   Acute Office Visit  Subjective:    Patient ID: Kimberly Rice, female    DOB: [birth year], 42 y.o.   MRN: [id number]   HPI 42 y.o. presents today for left breast pain x 1 month. Pain is bilateral, shooting, intermittent, L>R, in outer quadrants. Pain is worse with certain movements, like leaning or lying on her side. Denies redness, mass, nipple drainage, or skin changes. Does not recall any injury. Has never had screening mammogram. Requesting annual exam during visit but made aware that this is a problem visit and was scheduled accordingly by patient.    Review of Systems  Constitutional: Negative.   Hematological:  Negative for adenopathy.  Right breast: Positive for pain. Negative for nipple discharge, redness, swelling, lumps, or skin changes Left breast: Positive for pain. Negative for nipple discharge, redness, swelling, lumps, or skin changes     Objective:    Physical Exam Constitutional:      Appearance: Normal appearance.  Chest:  Breasts:    Right: Normal.     Left: Tenderness present. No swelling, bleeding, inverted nipple, mass, nipple discharge or skin change.    Lymphadenopathy:     Upper Body:     Right upper body: No supraclavicular or axillary adenopathy.     Left upper body: No supraclavicular or axillary adenopathy.     BP 136/82   Pulse 87   Ht 5\' 2"  (1.575 m)   Wt 204 lb (92.5 kg)   SpO2 100%   BMI 37.31 kg/m  Wt Readings from Last 3 Encounters:  06/08/22 204 lb (92.5 kg)  10/14/20 196 lb 3.2 oz (89 kg)  06/17/20 187 lb (84.8 kg)        Patient informed chaperone available to be present for breast and/or pelvic exam. Patient has requested no chaperone to be present. Patient has been advised what will be completed during breast and pelvic exam.   Assessment & Plan:   Problem List Items Addressed This Visit   None Visit Diagnoses     Pain of left breast    -  Primary      Plan: Normal breast exam today. Possibility of  costochondritis but pain has been ongoing for a month without improvement. Will send referral for bilateral diagnostic mammogram. Patient upset about not receiving annual exam today due to having to pay for visit. Educated on why annual exam not appropriate. Also informed that imaging is diagnostic and not a screening due to breast concerns. Verbalized understanding.      Olivia Mackie DNP, 10:17 AM 07/29/2022

## 2022-06-09 ENCOUNTER — Telehealth: Payer: Self-pay

## 2022-06-09 ENCOUNTER — Other Ambulatory Visit (HOSPITAL_COMMUNITY): Payer: Self-pay | Admitting: Nurse Practitioner

## 2022-06-09 DIAGNOSIS — N644 Mastodynia: Secondary | ICD-10-CM

## 2022-06-09 NOTE — Telephone Encounter (Addendum)
Diagnostic Bilateral mammo and left breast u/s scheduled with [facility] for Monday 06/15/22 at 8:45am arrive at 8:30am.  2-3 hours long appt Both exams and then radiologist views and then will speak with patient.  [place]. [address]  No talcum powder, lotions, perfume, etc  Make Provisions for young children as they can not come into screening area.  Order faxed over to [facility].

## 2022-06-09 NOTE — Telephone Encounter (Signed)
Patient called back and said she would like to keep Kimberly Rice appt on 06/15/22 at 8:45am as [name] cannot get her in as soon as she thought they could.  I have not yet cancelled Kimberly Rice so appt confirmed and instructions relayed.  Order has been faxed to [facility]. Marland Kitchen

## 2022-06-09 NOTE — Telephone Encounter (Signed)
Diagnostic screening mammogram Received: Gaynelle Adu, NP  [facility] Triage Please send referral for diagnostic screening mammogram for pain in left breast. Has not had a screening mammogram.

## 2022-06-09 NOTE — Telephone Encounter (Addendum)
I called patient to relay this info to her.  She told me she is in [place] and prefers to go to [facility] in [place] for this study.  Orders placed with [name] and patient said she will call there to schedule. She had already spoken with them and said they had openings this week.

## 2022-06-11 ENCOUNTER — Ambulatory Visit (HOSPITAL_COMMUNITY)
Admission: RE | Admit: 2022-06-11 | Discharge: 2022-06-11 | Disposition: A | Discharge status: Home / Self Care | Payer: No Typology Code available for payment source | Source: Ambulatory Visit | Attending: Nurse Practitioner | Admitting: Nurse Practitioner

## 2022-06-11 ENCOUNTER — Encounter (HOSPITAL_COMMUNITY): Payer: Self-pay

## 2022-06-11 DIAGNOSIS — N644 Mastodynia: Secondary | ICD-10-CM

## 2022-06-17 ENCOUNTER — Ambulatory Visit: Payer: No Typology Code available for payment source | Admitting: Nurse Practitioner

## 2022-06-25 ENCOUNTER — Ambulatory Visit (HOSPITAL_COMMUNITY): Payer: PRIVATE HEALTH INSURANCE

## 2022-06-25 ENCOUNTER — Encounter (HOSPITAL_COMMUNITY): Payer: PRIVATE HEALTH INSURANCE

## 2022-07-08 ENCOUNTER — Ambulatory Visit: Payer: No Typology Code available for payment source | Admitting: Nurse Practitioner

## 2022-07-23 LAB — LAB REPORT - SCANNED

## 2022-07-24 LAB — LAB REPORT - SCANNED
A1c: 5.5
EGFR: 113

## 2022-07-24 LAB — HM PAP SMEAR

## 2022-08-26 ENCOUNTER — Encounter (INDEPENDENT_AMBULATORY_CARE_PROVIDER_SITE_OTHER): Payer: Self-pay

## 2022-09-01 ENCOUNTER — Encounter: Payer: Self-pay | Admitting: Internal Medicine

## 2022-09-01 ENCOUNTER — Ambulatory Visit (INDEPENDENT_AMBULATORY_CARE_PROVIDER_SITE_OTHER): Payer: No Typology Code available for payment source | Admitting: Internal Medicine

## 2022-09-01 VITALS — BP 158/95 | HR 84 | Ht 62.0 in | Wt 208.0 lb

## 2022-09-01 DIAGNOSIS — E782 Mixed hyperlipidemia: Secondary | ICD-10-CM

## 2022-09-01 DIAGNOSIS — G4733 Obstructive sleep apnea (adult) (pediatric): Secondary | ICD-10-CM

## 2022-09-01 DIAGNOSIS — Z8639 Personal history of other endocrine, nutritional and metabolic disease: Secondary | ICD-10-CM

## 2022-09-01 DIAGNOSIS — I10 Essential (primary) hypertension: Secondary | ICD-10-CM | POA: Diagnosis not present

## 2022-09-01 DIAGNOSIS — G919 Hydrocephalus, unspecified: Secondary | ICD-10-CM | POA: Diagnosis not present

## 2022-09-01 DIAGNOSIS — N959 Unspecified menopausal and perimenopausal disorder: Secondary | ICD-10-CM

## 2022-09-01 DIAGNOSIS — M1990 Unspecified osteoarthritis, unspecified site: Secondary | ICD-10-CM | POA: Diagnosis not present

## 2022-09-01 DIAGNOSIS — Z0001 Encounter for general adult medical examination with abnormal findings: Secondary | ICD-10-CM

## 2022-09-01 DIAGNOSIS — D8940 Mast cell activation, unspecified: Secondary | ICD-10-CM

## 2022-09-01 MED ORDER — OLMESARTAN MEDOXOMIL 20 MG PO TABS: 20.0000 mg | ORAL_TABLET | Freq: Every day | ORAL | 2 refills | Status: DC

## 2022-09-01 NOTE — Patient Instructions (Signed)
It was a pleasure to see you today.  Thank you for giving [place] the opportunity to be involved in your care.  Below is a brief recap of your visit and next steps.  We will plan to see you again in 4 weeks.  Summary You have established care today Start olmesartan 20 mg daily for hypertension Follow up in 4 weeks for BP check through video

## 2022-09-01 NOTE — Progress Notes (Signed)
New Patient Office Visit  Subjective    Patient ID: Kimberly Rice, female    DOB: 1980-04-30  Age: 42 y.o. MRN: [id number]  CC:  Chief Complaint  Patient presents with   Establish Care   HPI Kimberly Rice presents to establish care and for annual exam.  She is a 42 year old woman with a complicated past medical history significant for hydrocephalus s/p VP shunt with last revision at age 68, OSA, inflammatory arthritis, mast cell activation syndrome, HLD, HTN, vitamin D deficiency, and perimenopausal disorder.  [name] reports feeling ok today.  She endorses diffuse musculoskeletal pain and fatigue.  She would like to review labs that were updated last month.  She does not have any additional concerns to discuss.  Chronic medical conditions and outstanding preventative care items discussed today are individually addressed A/P below.  Outpatient Encounter Medications as of 09/01/2022  Medication Sig   albuterol (VENTOLIN HFA) 108 (90 Base) MCG/ACT inhaler Can inhale two puffs every four to six hours as needed for cough or wheeze.   aspirin EC 325 MG tablet Take 1 tablet (325 mg total) by mouth daily.   EPINEPHrine (AUVI-Q) 0.3 mg/0.3 mL IJ SOAJ injection Inject 0.3 mg into the muscle as needed for anaphylaxis. As directed for life-threatening allergic reactions   famotidine (PEPCID) 40 MG tablet Take 1 tablet (40 mg total) by mouth 2 (two) times daily.   hydroxychloroquine (PLAQUENIL) 200 MG tablet Take 200 mg by mouth.   hydrOXYzine (ATARAX/VISTARIL) 10 MG tablet Take 1-2 tablets 1-3 times daily as needed   KETOTIFEN FUMARATE OP Take 1 capsule by mouth 2 (two) times daily.   montelukast (SINGULAIR) 10 MG tablet Take 1 tablet (10 mg total) by mouth at bedtime.   olmesartan (BENICAR) 20 MG tablet Take 1 tablet (20 mg total) by mouth daily.   [DISCONTINUED] loratadine (CLARITIN) 10 MG tablet Take 2 tablets twice a day   No facility-administered encounter medications on file as of  09/01/2022.    Past Medical History:  Diagnosis Date   Allergy    Anemia    Arthritis    Cancer (HCC)    skin   Hydrocephalus (HCC)    Hypertension    Neuromuscular disorder (HCC)    Ovarian cyst    Sleep apnea     Past Surgical History:  Procedure Laterality Date   BRAIN SURGERY     BUNIONECTOMY     ENDOMETRIAL ABLATION W/ NOVASURE  06/07/2017   KNEE SURGERY     LEFT OOPHORECTOMY  11/24/2016   Cyst    Family History  Problem Relation Age of Onset   Diabetes Mother    Hypertension Mother    Arthritis Mother    Heart disease Mother    Prostate cancer Father    Cancer Father    Hearing loss Father    Heart attack Maternal Grandfather    Cancer Maternal Grandfather    Heart disease Maternal Grandfather    Cancer Paternal Grandfather    Diabetes Paternal Grandmother    Cancer Paternal Aunt    Cancer Paternal Uncle    COPD Maternal Aunt     Social History   Socioeconomic History   Marital status: Married    Spouse name: Not on file   Number of children: Not on file   Years of education: Not on file   Highest education level: Not on file  Occupational History   Not on file  Tobacco Use   Smoking status:  Never   Smokeless tobacco: Never  Substance and Sexual Activity   Alcohol use: Never   Drug use: Never   Sexual activity: Yes    Birth control/protection: Surgical  Other Topics Concern   Not on file  Social History Narrative   Not on file   Social Determinants of Health   Financial Resource Strain: Not on file  Food Insecurity: Not on file  Transportation Needs: Not on file  Physical Activity: Not on file  Stress: Not on file  Social Connections: Not on file  Intimate Partner Violence: Not on file   Review of Systems  Constitutional:  Positive for malaise/fatigue. Negative for chills and fever.  HENT:  Negative for sore throat.   Respiratory:  Negative for cough and shortness of breath.   Cardiovascular:  Negative for chest pain,  palpitations and leg swelling.  Gastrointestinal:  Negative for abdominal pain, blood in stool, constipation, diarrhea, nausea and vomiting.  Genitourinary:  Negative for dysuria and hematuria.  Musculoskeletal:  Positive for joint pain and myalgias.  Skin:  Negative for itching and rash.  Neurological:  Negative for dizziness and headaches.  Psychiatric/Behavioral:  Negative for depression and suicidal ideas.    Objective    BP (!) 158/95   Pulse 84   Ht 5\' 2"  (1.575 m)   Wt 208 lb (94.3 kg)   SpO2 96%   BMI 38.04 kg/m   Physical Exam Vitals reviewed.  Constitutional:      General: She is not in acute distress.    Appearance: Normal appearance. She is not toxic-appearing.  HENT:     Head: Normocephalic and atraumatic.     Right Ear: External ear normal.     Left Ear: External ear normal.     Nose: Nose normal. No congestion or rhinorrhea.     Mouth/Throat:     Mouth: Mucous membranes are moist.     Pharynx: Oropharynx is clear. No oropharyngeal exudate or posterior oropharyngeal erythema.  Eyes:     General: No scleral icterus.    Extraocular Movements: Extraocular movements intact.     Conjunctiva/sclera: Conjunctivae normal.     Pupils: Pupils are equal, round, and reactive to light.  Cardiovascular:     Rate and Rhythm: Normal rate and regular rhythm.     Pulses: Normal pulses.     Heart sounds: Normal heart sounds. No murmur heard.    No friction rub. No gallop.  Pulmonary:     Effort: Pulmonary effort is normal.     Breath sounds: Normal breath sounds. No wheezing, rhonchi or rales.  Abdominal:     General: Abdomen is flat. Bowel sounds are normal. There is no distension.     Palpations: Abdomen is soft.     Tenderness: There is no abdominal tenderness.  Musculoskeletal:        General: No swelling. Normal range of motion.     Cervical back: Normal range of motion.     Right lower leg: No edema.     Left lower leg: No edema.  Lymphadenopathy:     Cervical:  No cervical adenopathy.  Skin:    General: Skin is warm and dry.     Capillary Refill: Capillary refill takes less than 2 seconds.     Coloration: Skin is not jaundiced.  Neurological:     General: No focal deficit present.     Mental Status: She is alert and oriented to person, place, and time.  Psychiatric:  Mood and Affect: Mood normal.        Behavior: Behavior normal.    Assessment & Plan:   Problem List Items Addressed This Visit       Essential hypertension - Primary    Previously documented history of essential hypertension but not on any antihypertensive medication currently.  BP is elevated today and she reports that it has been elevated at recent appointments as well. -Start olmesartan 20 mg daily -Follow-up in 4 weeks for BP check      OSA on CPAP    She endorses nightly compliance with CPAP      Hydrocephalus (HCC)    She endorses a history of hydrocephalus.  VP shunt placed as an infant with last revision occurring at age 17.  No issues currently.  She is followed by neurology who has discussed repeat MRI and referral to neurosurgery.      Inflammatory arthritis    Followed by rheumatology.  Currently prescribed Plaquenil.  She continues to endorse diffuse myalgias and fatigue.      Mast cell activation syndrome (HCC)    Followed by allergy and immunology.  She is currently prescribed hydroxyzine, ketotifen, and ASA for symptom control.      Hyperlipidemia    Lipid panel recently updated.  Total cholesterol 245 and LDL 171.  She is not currently on any cholesterol-lowering therapy and has reservations about starting statins. -No medication changes today -Lifestyle modifications aimed at lowering her cholesterol were reviewed      History of vitamin D deficiency    Currently prescribed daily vitamin D supplementation.  Vitamin D level adequate when updated last month.      Perimenopausal disorder    Follow[facility]   Currently on progesterone which has significantly improved her symptoms.      Encounter for general adult medical examination with abnormal findings    Presenting today for annual exam and to establish care.  Available records and labs have been reviewed. -Recent labs reviewed -Outstanding vaccines declined -Olmesartan started today for treatment of hypertension -We will tentatively plan for follow-up in 4 weeks for BP check       Return in about 4 weeks (around 09/29/2022) for HTN.   Billie Lade, MD

## 2022-09-07 DIAGNOSIS — E785 Hyperlipidemia, unspecified: Secondary | ICD-10-CM | POA: Insufficient documentation

## 2022-09-07 DIAGNOSIS — N959 Unspecified menopausal and perimenopausal disorder: Secondary | ICD-10-CM | POA: Insufficient documentation

## 2022-09-07 DIAGNOSIS — Z0001 Encounter for general adult medical examination with abnormal findings: Secondary | ICD-10-CM | POA: Insufficient documentation

## 2022-09-07 DIAGNOSIS — I10 Essential (primary) hypertension: Secondary | ICD-10-CM | POA: Insufficient documentation

## 2022-09-07 DIAGNOSIS — G4733 Obstructive sleep apnea (adult) (pediatric): Secondary | ICD-10-CM | POA: Insufficient documentation

## 2022-09-07 DIAGNOSIS — D8940 Mast cell activation, unspecified: Secondary | ICD-10-CM | POA: Insufficient documentation

## 2022-09-07 DIAGNOSIS — Z8639 Personal history of other endocrine, nutritional and metabolic disease: Secondary | ICD-10-CM | POA: Insufficient documentation

## 2022-09-07 DIAGNOSIS — M1990 Unspecified osteoarthritis, unspecified site: Secondary | ICD-10-CM | POA: Insufficient documentation

## 2022-09-07 DIAGNOSIS — G919 Hydrocephalus, unspecified: Secondary | ICD-10-CM | POA: Insufficient documentation

## 2022-09-07 NOTE — Assessment & Plan Note (Signed)
Presenting today for annual exam and to establish care.  Available records and labs have been reviewed. -Recent labs reviewed -Outstanding vaccines declined -Olmesartan started today for treatment of hypertension -We will tentatively plan for follow-up in 4 weeks for BP check

## 2022-09-07 NOTE — Assessment & Plan Note (Signed)
Followed by allergy and immunology.  She is currently prescribed hydroxyzine, ketotifen, and ASA for symptom control.

## 2022-09-07 NOTE — Assessment & Plan Note (Signed)
Followed by rheumatology.  Currently prescribed Plaquenil.  She continues to endorse diffuse myalgias and fatigue.

## 2022-09-07 NOTE — Assessment & Plan Note (Signed)
Currently prescribed daily vitamin D supplementation.  Vitamin D level adequate when updated last month.

## 2022-09-07 NOTE — Assessment & Plan Note (Signed)
She endorses a history of hydrocephalus.  VP shunt placed as an infant with last revision occurring at age 42.  No issues currently.  She is followed by neurology who has discussed repeat MRI and referral to neurosurgery.

## 2022-09-07 NOTE — Assessment & Plan Note (Addendum)
Lipid panel recently updated.  Total cholesterol 245 and LDL 171.  She is not currently on any cholesterol-lowering therapy and has reservations about starting statins. -No medication changes today -Lifestyle modifications aimed at lowering her cholesterol were reviewed

## 2022-09-07 NOTE — Assessment & Plan Note (Signed)
Followed by [facility]necology at [facility].  Currently on progesterone which has significantly improved her symptoms.

## 2022-09-07 NOTE — Assessment & Plan Note (Signed)
She endorses nightly compliance with CPAP 

## 2022-09-07 NOTE — Assessment & Plan Note (Signed)
Previously documented history of essential hypertension but not on any antihypertensive medication currently.  BP is elevated today and she reports that it has been elevated at recent appointments as well. -Start olmesartan 20 mg daily -Follow-up in 4 weeks for BP check

## 2022-10-05 ENCOUNTER — Telehealth: Payer: No Typology Code available for payment source | Admitting: Internal Medicine

## 2022-10-05 ENCOUNTER — Encounter: Payer: Self-pay | Admitting: Internal Medicine

## 2022-10-05 DIAGNOSIS — I10 Essential (primary) hypertension: Secondary | ICD-10-CM | POA: Diagnosis not present

## 2022-10-05 MED ORDER — OLMESARTAN MEDOXOMIL 20 MG PO TABS: 20.0000 mg | ORAL_TABLET | Freq: Every day | ORAL | 1 refills | Status: DC

## 2022-10-05 MED ORDER — AMLODIPINE BESYLATE 5 MG PO TABS: 5.0000 mg | ORAL_TABLET | Freq: Every day | ORAL | 1 refills | Status: DC

## 2022-10-05 NOTE — Assessment & Plan Note (Signed)
Presenting today for HTN follow-up through video encounter.  Olmesartan 20 mg daily was started at her last appointment.  She has been checking her blood pressure daily as instructed and readings remain largely uncontrolled. -Add amlodipine 5 mg daily -Continue olmesartan 20 mg daily as previously prescribed -Follow-up in 4 weeks for HTN check through virtual encounter

## 2022-10-05 NOTE — Progress Notes (Signed)
Virtual Visit via Video Note  I connected with Kimberly Rice on 10/05/22 at  2:00 PM EDT by a video enabled telemedicine application and verified that I am speaking with the correct person using two identifiers.  Patient Location: Home Provider Location: Office/Clinic  I discussed the limitations, risks, security, and privacy concerns of performing an evaluation and management service by video and the availability of in person appointments. I also discussed with the patient that there may be a patient responsible charge related to this service. The patient expressed understanding and agreed to proceed.  Subjective: PCP: Kimberly Lade, MD  Chief Complaint  Patient presents with   Hypertension    Follow up   Kimberly Rice has been evaluated today through video encounter for HTN follow-up.  She was last evaluated by me on 6/11 as a new patient presenting to establish care.  She had multiple concerns to discuss, including poorly controlled hypertension.  Olmesartan 20 mg daily was started and 4-week follow-up was arranged.  There have been no acute interval events.  Kimberly Rice has been checking her blood pressure daily as instructed.  She reports systolic readings ranging 180-140 mmHg and diastolic readings ranging 90-70 mmHg.  She has been taking olmesartan as prescribed and has not experienced any adverse side effects.  She does not have additional concerns to discuss today.  ROS: Per HPI  Current Outpatient Medications:    albuterol (VENTOLIN HFA) 108 (90 Base) MCG/ACT inhaler, Can inhale two puffs every four to six hours as needed for cough or wheeze., Disp: 1 each, Rfl: 1   amLODipine (NORVASC) 5 MG tablet, Take 1 tablet (5 mg total) by mouth daily., Disp: 90 tablet, Rfl: 1   aspirin EC 325 MG tablet, Take 1 tablet (325 mg total) by mouth daily., Disp: 30 tablet, Rfl: 2   EPINEPHrine (AUVI-Q) 0.3 mg/0.3 mL IJ SOAJ injection, Inject 0.3 mg into the muscle as needed for anaphylaxis. As  directed for life-threatening allergic reactions, Disp: 2 each, Rfl: 1   famotidine (PEPCID) 40 MG tablet, Take 1 tablet (40 mg total) by mouth 2 (two) times daily., Disp: 180 tablet, Rfl: 5   hydroxychloroquine (PLAQUENIL) 200 MG tablet, Take 200 mg by mouth., Disp: , Rfl:    hydrOXYzine (ATARAX/VISTARIL) 10 MG tablet, Take 1-2 tablets 1-3 times daily as needed, Disp: 60 tablet, Rfl: 0   KETOTIFEN FUMARATE OP, Take 1 capsule by mouth 2 (two) times daily., Disp: , Rfl:    montelukast (SINGULAIR) 10 MG tablet, Take 1 tablet (10 mg total) by mouth at bedtime., Disp: 30 tablet, Rfl: 5   olmesartan (BENICAR) 20 MG tablet, Take 1 tablet (20 mg total) by mouth daily., Disp: 90 tablet, Rfl: 1  Assessment and Plan:  Essential hypertension Assessment & Plan: Presenting today for HTN follow-up through video encounter.  Olmesartan 20 mg daily was started at her last appointment.  She has been checking her blood pressure daily as instructed and readings remain largely uncontrolled. -Add amlodipine 5 mg daily -Continue olmesartan 20 mg daily as previously prescribed -Follow-up in 4 weeks for HTN check through virtual encounter  Follow Up Instructions: Return in about 4 weeks (around 11/02/2022) for HTN.   I discussed the assessment and treatment plan with the patient. The patient was provided an opportunity to ask questions, and all were answered. The patient agreed with the plan and demonstrated an understanding of the instructions.   The patient was advised to call back or seek an in-person  evaluation if the symptoms worsen or if the condition fails to improve as anticipated.  The above assessment and management plan was discussed with the patient. The patient verbalized understanding of and has agreed to the management plan.   Kimberly Lade, MD

## 2022-11-02 ENCOUNTER — Telehealth (INDEPENDENT_AMBULATORY_CARE_PROVIDER_SITE_OTHER): Payer: No Typology Code available for payment source | Admitting: Internal Medicine

## 2022-11-02 ENCOUNTER — Encounter: Payer: Self-pay | Admitting: Internal Medicine

## 2022-11-02 DIAGNOSIS — G4733 Obstructive sleep apnea (adult) (pediatric): Secondary | ICD-10-CM

## 2022-11-02 DIAGNOSIS — I10 Essential (primary) hypertension: Secondary | ICD-10-CM | POA: Diagnosis not present

## 2022-11-02 DIAGNOSIS — G919 Hydrocephalus, unspecified: Secondary | ICD-10-CM | POA: Diagnosis not present

## 2022-11-02 DIAGNOSIS — D8940 Mast cell activation, unspecified: Secondary | ICD-10-CM

## 2022-11-02 NOTE — Assessment & Plan Note (Signed)
Neurosurgery referral placed today at patient's request.  She endorses a history of hydrocephalus s/p VP shunt placement as an infant with last revision occurring at age 42.  Previously followed by a neurologist, who had discussed repeat MRI brain and a referral to neurosurgery.

## 2022-11-02 NOTE — Assessment & Plan Note (Signed)
Sleep medicine referral placed today at patient's request.  She states that her insurance company has told her she needs an updated sleep study.

## 2022-11-02 NOTE — Assessment & Plan Note (Signed)
Evaluated today through video encounter for follow-up of hypertension.  Amlodipine 5 mg daily was added at her last appointment.  She is additionally prescribed olmesartan 20 mg daily.  Today she reports that her blood pressure remains above goal.  Recent readings have been 160s/90s.  Treatment options were reviewed and she would like to double the doses of both olmesartan and amlodipine (olmesartan 40 mg daily and amlodipine 10 mg daily).  This is reasonable given the degree of uncontrolled HTN. -Patient will notify our office if BP readings remain elevated.  Consider adding additional medication at that time.  Otherwise, we will schedule routine follow-up in 4 months.

## 2022-11-02 NOTE — Assessment & Plan Note (Signed)
Allergy and immunology referral placed at patient's request.

## 2022-11-02 NOTE — Progress Notes (Signed)
Virtual Visit via Video Note  I connected with Kimberly Rice on 11/02/22 at  1:40 PM EDT by a video enabled telemedicine application and verified that I am speaking with the correct person using two identifiers.  Patient Location: Home Provider Location: Office/Clinic  I discussed the limitations, risks, security, and privacy concerns of performing an evaluation and management service by video and the availability of in person appointments. I also discussed with the patient that there may be a patient responsible charge related to this service. The patient expressed understanding and agreed to proceed.  Subjective: PCP: Kimberly Lade, MD  Chief Complaint  Patient presents with   Hypertension   Kimberly Rice has been evaluated today through video encounter for HTN follow-up.  Last evaluated by me on 7/15 for HTN follow-up.  Her blood pressure remained elevated at that time.  Amlodipine 5 mg daily was added to her antihypertensive regimen.  4-week follow-up was arranged for HTN check through virtual encounter.  There have been no acute interval events.   Kimberly Rice states that there has been no significant improvement in her blood pressure readings.  She reports recent readings of 160s/90s.  She is interested in doubling the dose of both olmesartan and amlodipine.  Today she additionally requests referrals to neurosurgery, allergy and immunology, and pulmonology for management of hydrocephalus, mast cell activation syndrome, and OSA respectively.   ROS: Per HPI  Current Outpatient Medications:    albuterol (VENTOLIN HFA) 108 (90 Base) MCG/ACT inhaler, Can inhale two puffs every four to six hours as needed for cough or wheeze., Disp: 1 each, Rfl: 1   amLODipine (NORVASC) 5 MG tablet, Take 1 tablet (5 mg total) by mouth daily., Disp: 90 tablet, Rfl: 1   aspirin EC 325 MG tablet, Take 1 tablet (325 mg total) by mouth daily., Disp: 30 tablet, Rfl: 2   EPINEPHrine (AUVI-Q) 0.3 mg/0.3 mL IJ  SOAJ injection, Inject 0.3 mg into the muscle as needed for anaphylaxis. As directed for life-threatening allergic reactions, Disp: 2 each, Rfl: 1   famotidine (PEPCID) 40 MG tablet, Take 1 tablet (40 mg total) by mouth 2 (two) times daily., Disp: 180 tablet, Rfl: 5   hydroxychloroquine (PLAQUENIL) 200 MG tablet, Take 200 mg by mouth., Disp: , Rfl:    hydrOXYzine (ATARAX/VISTARIL) 10 MG tablet, Take 1-2 tablets 1-3 times daily as needed, Disp: 60 tablet, Rfl: 0   KETOTIFEN FUMARATE OP, Take 1 capsule by mouth 2 (two) times daily., Disp: , Rfl:    montelukast (SINGULAIR) 10 MG tablet, Take 1 tablet (10 mg total) by mouth at bedtime., Disp: 30 tablet, Rfl: 5   olmesartan (BENICAR) 20 MG tablet, Take 1 tablet (20 mg total) by mouth daily., Disp: 90 tablet, Rfl: 1  Assessment and Plan:  Mast cell activation syndrome [facility]) Assessment & Plan: Allergy and immunology referral placed at patient's request.  Hydrocephalus, unspecified type [facility]) Assessment & Plan: Neurosurgery referral placed today at patient's request.  She endorses a history of hydrocephalus s/p VP shunt placement as an infant with last revision occurring at age 42.  Previously followed by a neurologist, who had discussed repeat MRI brain and a referral to neurosurgery.  OSA on CPAP Assessment & Plan: Sleep medicine referral placed today at patient's request.  She states that her insurance company has told her she needs an updated sleep study.  Essential hypertension Assessment & Plan: Evaluated today through video encounter for follow-up of hypertension.  Amlodipine 5 mg daily was  added at her last appointment.  She is additionally prescribed olmesartan 20 mg daily.  Today she reports that her blood pressure remains above goal.  Recent readings have been 160s/90s.  Treatment options were reviewed and she would like to double the doses of both olmesartan and amlodipine (olmesartan 40 mg daily and amlodipine 10 mg daily).  This is  reasonable given the degree of uncontrolled HTN. -Patient will notify our office if BP readings remain elevated.  Consider adding additional medication at that time.  Otherwise, we will schedule routine follow-up in 4 months.  Follow Up Instructions: Return in about 4 months (around 03/04/2023).   I discussed the assessment and treatment plan with the patient. The patient was provided an opportunity to ask questions, and all were answered. The patient agreed with the plan and demonstrated an understanding of the instructions.   The patient was advised to call back or seek an in-person evaluation if the symptoms worsen or if the condition fails to improve as anticipated.  The above assessment and management plan was discussed with the patient. The patient verbalized understanding of and has agreed to the management plan.   Kimberly Lade, MD

## 2022-11-10 ENCOUNTER — Encounter: Payer: Self-pay | Admitting: Internal Medicine

## 2023-01-17 IMAGING — MR MR SACRUM / SI JOINTS WO CM
4 of 6 series · 19 of 48 positions shown · non-contrast
Comparison: X-ray 09/09/2020

CLINICAL DATA: Back pain

EXAM:
MRI SACRUM WITHOUT CONTRAST
TECHNIQUE: Multiplanar multi-sequence MR imaging of the sacrum was performed.
No intravenous contrast was administered.

[Series 3: T1 · oblique · 4.0mm · 0.41mm/px · 7 of 19 slices shown (1 of 2)]
[im 1/19]
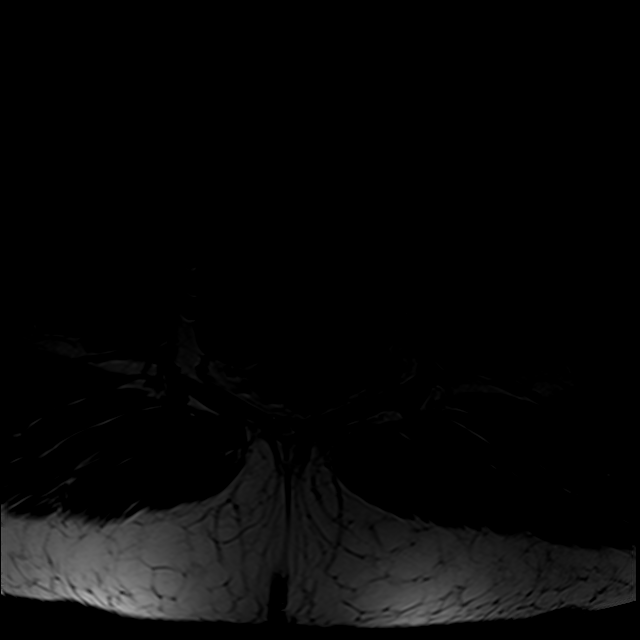
[im 4/19]
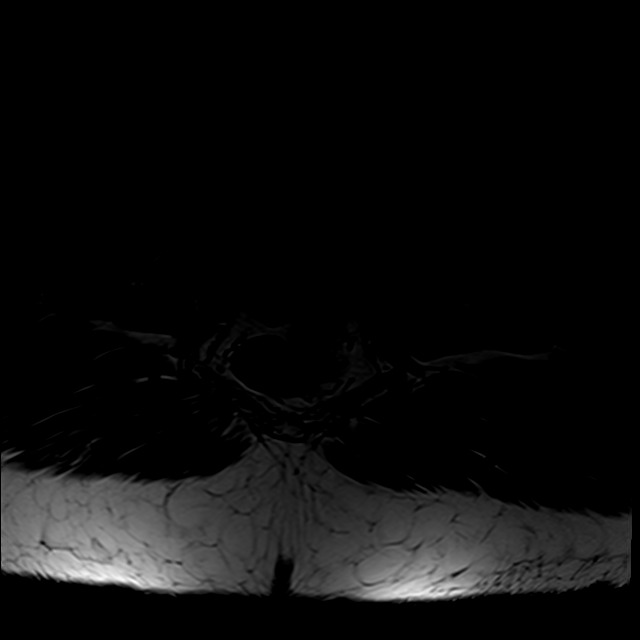
[im 7/19]
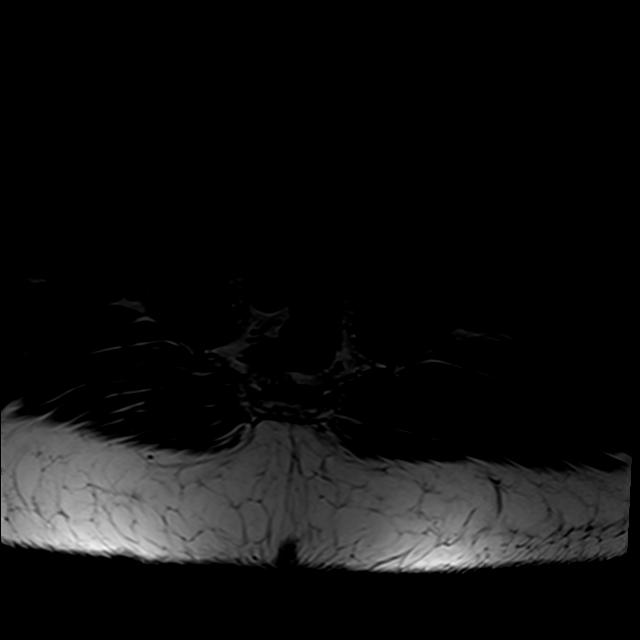
[im 10/19]
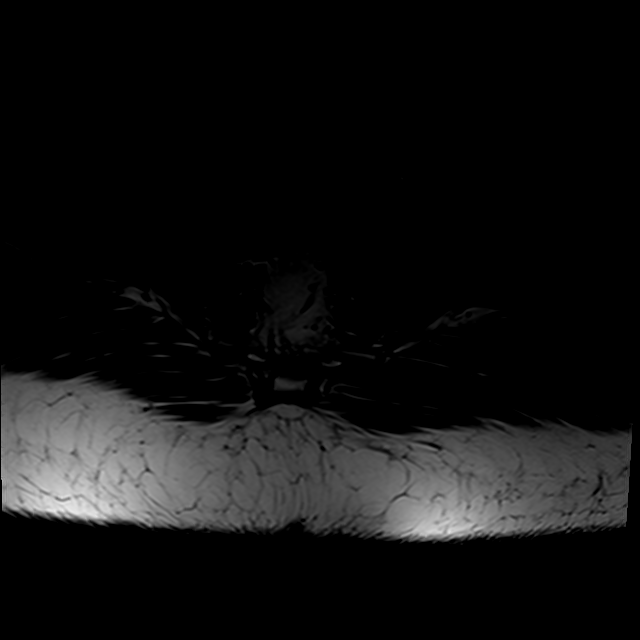
[im 13/19]
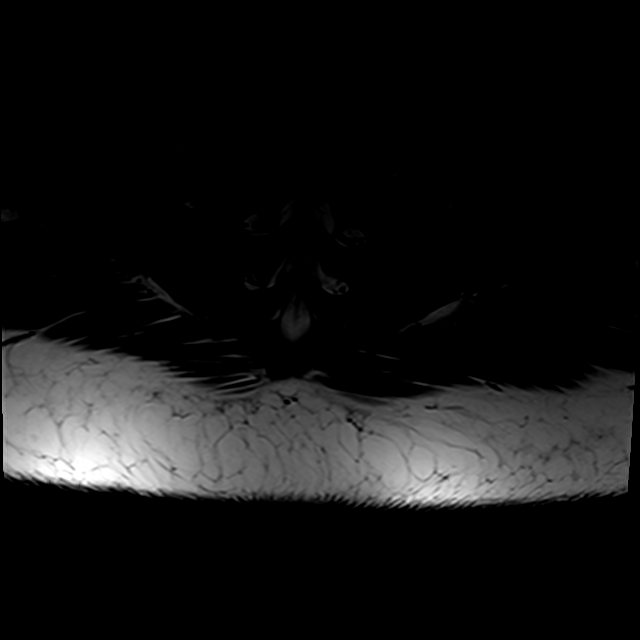
[im 16/19]
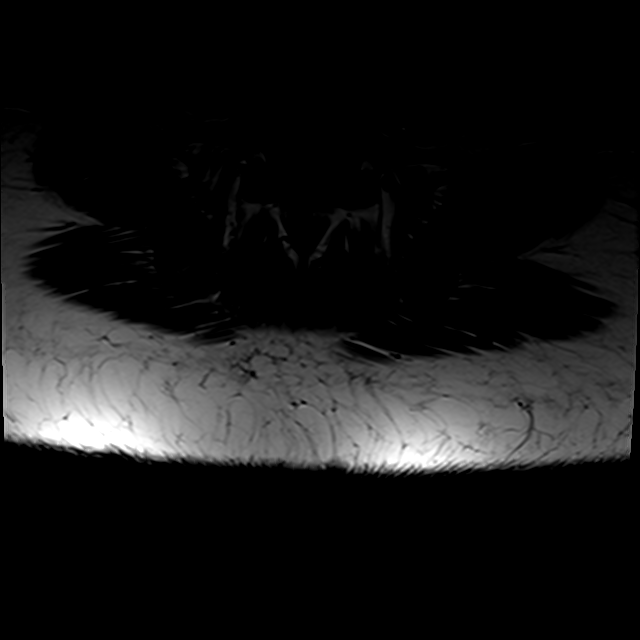
[im 19/19]
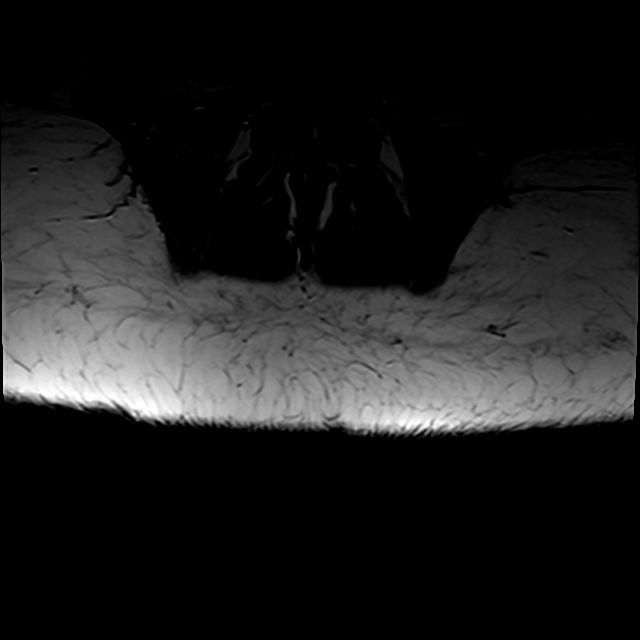

[Series 4: STIR · oblique · 4.0mm · 0.51mm/px · 6 of 19 slices shown]
[im 1/19]
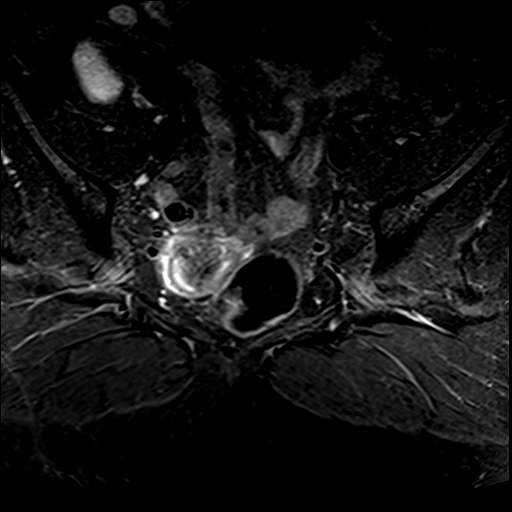
[im 4/19]
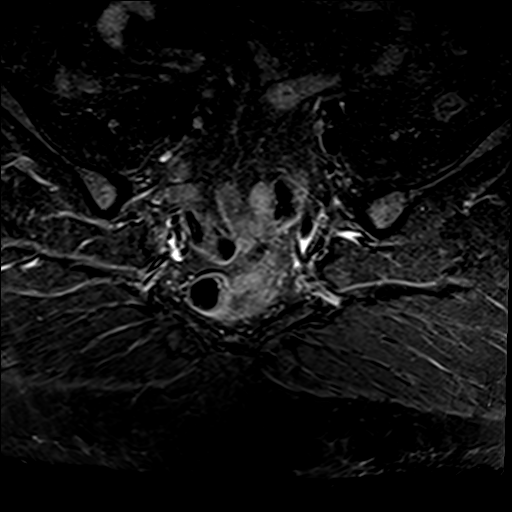
[im 8/19]
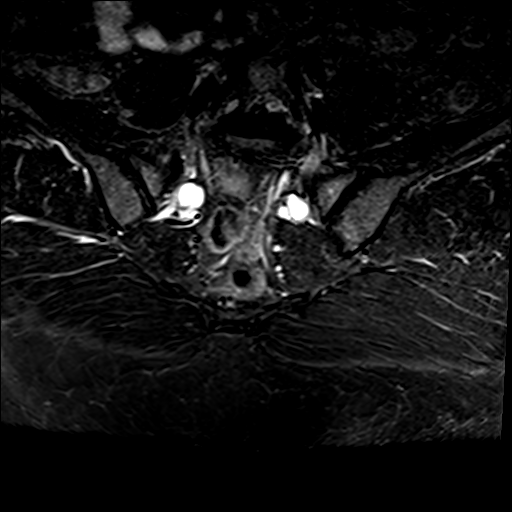
[im 11/19]
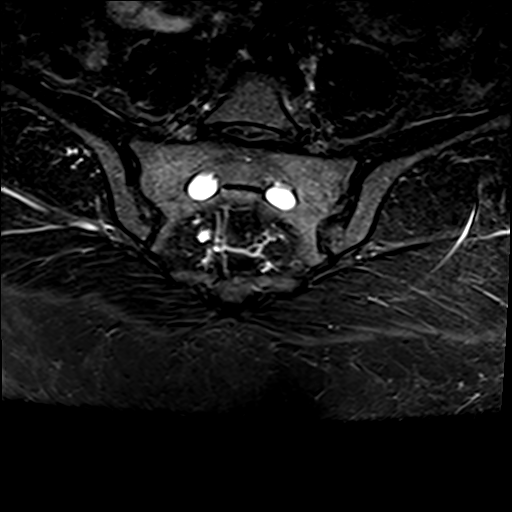
[im 15/19]
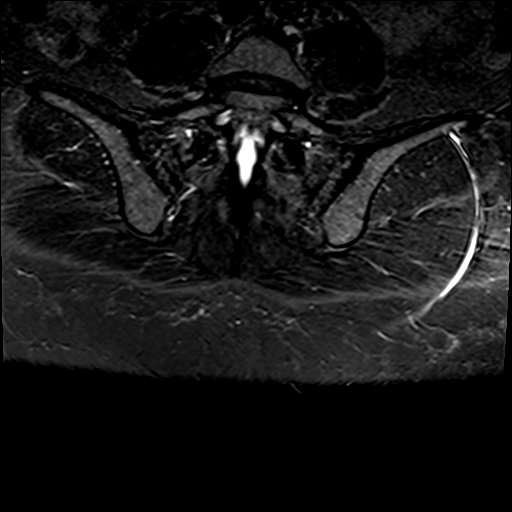
[im 19/19]
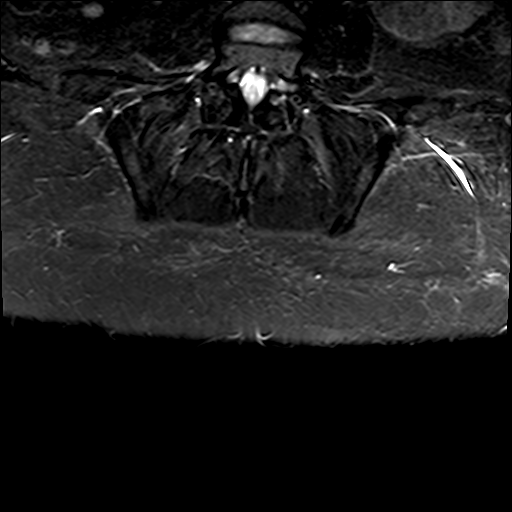

[Series 5: T2 fat-sat · sagittal · 4.0mm · 0.94mm/px · 3 of 34 slices shown]
[im 4/34]
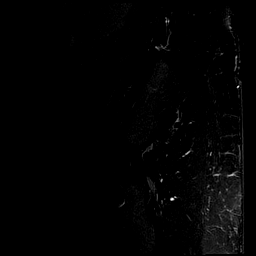
[im 17/34]
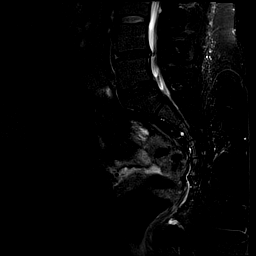
[im 30/34]
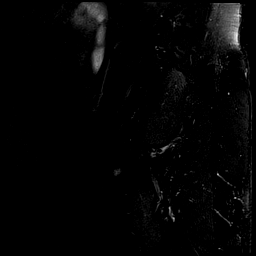

[Series 7: T1 · axial · 5.0mm · 0.47mm/px · z∈[-196,-105]mm · 3 of 24 slices shown (2 of 2)]
[im 4/24]
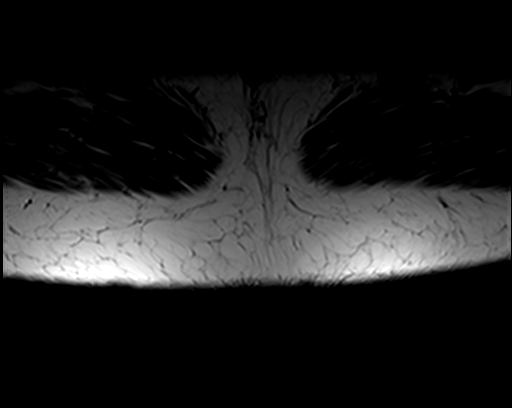
[im 14/24]
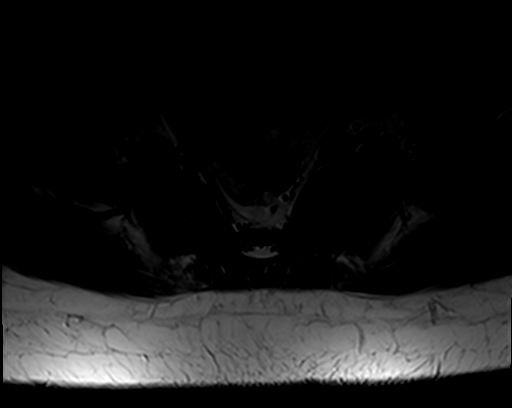
[im 20/24]
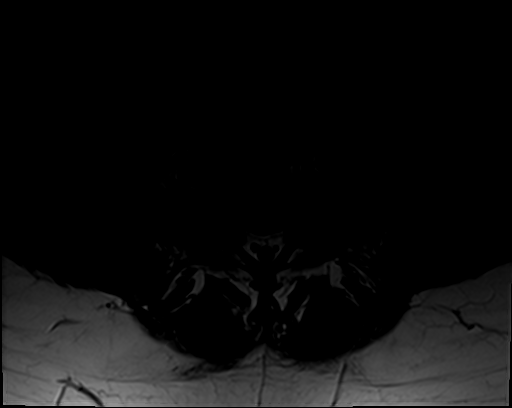

[19 of 48 positions shown; findings below may reference images not displayed]

FINDINGS: Bones/Joint/Cartilage

Bilateral SI joints are intact without fracture or diastasis. SI
joint spaces are preserved. No subchondral marrow signal changes. No
erosion. No SI joint effusion. No ankylosis. The remaining
visualized osseous structures appear intact and unremarkable. No
bone marrow edema. No marrow replacing bone lesion. Disc desiccation
and small disc protrusions at L4-5 and L5-S1, incompletely evaluated
on the included planes (series 5, images 16-17).

Ligaments

Intact.

Muscles and Tendons

Normal.  No intramuscular signal abnormality.

Soft tissues

No soft tissue edema. No fluid collections. No acute abnormality
within the visualized pelvis.
IMPRESSION: 1. Normal MRI of the sacrum. No evidence of sacroiliitis.
2. Disc desiccation and small disc protrusions at L4-5 and L5-S1,
incompletely evaluated on the included planes. Further evaluation
could be performed with a dedicated MRI of the lumbar spine, as
clinically indicated.

## 2023-03-01 ENCOUNTER — Ambulatory Visit: Payer: No Typology Code available for payment source | Admitting: Internal Medicine

## 2023-03-03 ENCOUNTER — Ambulatory Visit: Payer: No Typology Code available for payment source | Admitting: Allergy & Immunology

## 2023-03-04 ENCOUNTER — Ambulatory Visit: Payer: No Typology Code available for payment source | Admitting: Neurosurgery

## 2023-03-08 ENCOUNTER — Ambulatory Visit: Payer: No Typology Code available for payment source | Admitting: Internal Medicine

## 2023-03-26 ENCOUNTER — Encounter: Payer: Self-pay | Admitting: Neurosurgery

## 2023-03-26 NOTE — Progress Notes (Signed)
 Referring Physician:  Melvenia Manus BRAVO, MD [address] [place],  [place] [address]  Primary Physician:  Melvenia Manus BRAVO, MD  History of Present Illness: 03/30/2023 Kimberly Rice is here today to establish care with neurosurgery.  She had a history of a nonprogrammable VP shunt placed at 35 weeks old.  She states that she had 1 catheter revision as a teenager for lengthening however she has not had any history of shunt failure or requirement for shunt revision.  She has not had any significant changes in her balance or acute cognitive changes although she does express a longstanding history of intermittent cognitive trouble and decline.  She also notes having swelling around her shunt valve a couple of weeks ago with tenderness around that area.  There is no fever or drainage and this has resolved on its own.   Past Surgery: Brain surgery VP shunt several years ago  [name] has no symptoms of cervical myelopathy.  The symptoms are causing a significant impact on the patient's life.   Review of Systems:  A 10 point review of systems is negative, except for the pertinent positives and negatives detailed in the HPI.  Past Medical History: Past Medical History:  Diagnosis Date   Allergy     Anemia    Arthritis    Cancer (HCC)    skin   Hydrocephalus (HCC)    Hypertension    Neuromuscular disorder (HCC)    Ovarian cyst    Sleep apnea     Past Surgical History: Past Surgical History:  Procedure Laterality Date   BRAIN SURGERY     VP shunt   BUNIONECTOMY     ENDOMETRIAL ABLATION W/ NOVASURE  06/07/2017   KNEE SURGERY     LEFT OOPHORECTOMY  11/24/2016   Cyst    Allergies: Allergies as of 03/30/2023 - Review Complete 03/30/2023  Allergen Reaction Noted   Wound dressing adhesive  06/17/2020    Medications: Outpatient Encounter Medications as of 03/30/2023  Medication Sig   albuterol  (VENTOLIN  HFA) 108 (90 Base) MCG/ACT inhaler Can inhale two puffs every  four to six hours as needed for cough or wheeze.   aspirin  EC 325 MG tablet Take 1 tablet (325 mg total) by mouth daily.   azelastine  (ASTELIN ) 0.1 % nasal spray Place 2 sprays into both nostrils 2 (two) times daily as needed. Use in each nostril as directed   cyclobenzaprine (FLEXERIL) 10 MG tablet TAKE 1 TABLET BY MOUTH THREE TIMES DAILY AS NEEDED FOR MUSCLE SPASM FOR 5 DAYS   EPINEPHrine  (AUVI-Q ) 0.3 mg/0.3 mL IJ SOAJ injection Inject 0.3 mg into the muscle as needed for anaphylaxis. As directed for life-threatening allergic reactions   famotidine  (PEPCID ) 40 MG tablet Take 1 tablet (40 mg total) by mouth 2 (two) times daily.   hydroxychloroquine (PLAQUENIL) 200 MG tablet Take 200 mg by mouth.   hydrOXYzine  (ATARAX /VISTARIL ) 10 MG tablet Take 1-2 tablets 1-3 times daily as needed   KETOTIFEN FUMARATE OP Take 1 capsule by mouth 2 (two) times daily.   levocetirizine (XYZAL ) 5 MG tablet Take 1 tablet (5 mg total) by mouth in the morning and at bedtime.   mometasone  (NASONEX ) 50 MCG/ACT nasal spray Place 2 sprays into the nose daily.   progesterone (PROMETRIUM) 100 MG capsule Take 100 mg by mouth daily.   [DISCONTINUED] amLODipine  (NORVASC ) 5 MG tablet Take 1 tablet (5 mg total) by mouth daily. (Patient not taking: Reported on 03/30/2023)   [  DISCONTINUED] montelukast  (SINGULAIR ) 10 MG tablet Take 1 tablet (10 mg total) by mouth at bedtime. (Patient not taking: Reported on 03/30/2023)   [DISCONTINUED] olmesartan  (BENICAR ) 20 MG tablet Take 1 tablet (20 mg total) by mouth daily. (Patient not taking: Reported on 03/30/2023)   No facility-administered encounter medications on file as of 03/30/2023.    Social History: Social History   Tobacco Use   Smoking status: Never   Smokeless tobacco: Never  Substance Use Topics   Alcohol use: Never   Drug use: Never    Family Medical History: Family History  Problem Relation Age of Onset   Diabetes Mother    Hypertension Mother    Arthritis Mother     Heart disease Mother    Prostate cancer Father    Cancer Father    Hearing loss Father    Heart attack Maternal Grandfather    Cancer Maternal Grandfather    Heart disease Maternal Grandfather    Cancer Paternal Grandfather    Diabetes Paternal Grandmother    Cancer Paternal Aunt    Cancer Paternal Uncle    COPD Maternal Aunt     Physical Examination: Today's Vitals   03/30/23 1117  BP: 128/82  Weight: 94.8 kg  Height: 5' 2 (1.575 m)  PainSc: 1   PainLoc: Head   Body mass index is 38.23 kg/m.   General: Patient is well developed, well nourished, calm, collected, and in no apparent distress. Attention to examination is appropriate.  Psychiatric: Patient is non-anxious.  Head:  Pupils equal, round, and reactive to light.  ENT:  Oral mucosa appears well hydrated.  Neck:   Supple.   Respiratory: Patient is breathing without any difficulty.  Extremities: No edema.  Vascular: Palpable dorsal pedal pulses.  Skin:   On exposed skin, there are no abnormal skin lesions.  NEUROLOGICAL:     Awake, alert, oriented to person, place, and time.  Speech is clear and fluent. Fund of knowledge is appropriate.   Cranial Nerves: Pupils equal round and reactive to light.  Facial tone is symmetric.  Facial sensation is symmetric.    Strength: Side Biceps Triceps Deltoid Interossei Grip Wrist Ext. Wrist Flex.  R 5 5 5 5 5 5 5   L 5 5 5 5 5 5 5    Side Iliopsoas Quads Hamstring PF DF EHL  R 5 5 5 5 5 5   L 5 5 5 5 5 5    Reflexes are 2+ and symmetric at the biceps, triceps, brachioradialis, patella and achilles.   Hoffman's is absent.  Clonus is not present.  Toes are down-going.  Bilateral upper and lower extremity sensation is intact to light touch.    Gait is normal.   No difficulty with tandem gait.   No evidence of dysmetria noted.  Shunt valve pumps and refills  Medical Decision Making  Imaging: No imaging within the last year to review.  Assessment and Plan: Ms.  Bixby is a pleasant 43 y.o. 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  She has seen neurosurgery intermittently in the past however has always been discharged or lost to follow-up due to moving.  She would like to have annual visits to remain under the care of neurosurgery should she need it.  She is not currently having any symptoms concerning for shunt failure.  I would like to obtain a baseline head CT and shunt series.  We  discussed that she may not need reevaluation with imaging yearly but that if her preference is to be seen on an annual basis that our office to be more than happy to do so.  I will see her back after completion of her studies to review them and discuss further plan of care.  She expressed understanding and was in agreement with this plan.    Thank you for involving me in the care of this patient.   I spent a total of 50 minutes in both face-to-face and non-face-to-face activities for this visit on the date of this encounter including review of outside records, review previous imaging, discussion of symptoms, discussion of plan of care, documentation, and order placement.   [facility]

## 2023-03-29 ENCOUNTER — Encounter: Payer: Self-pay | Admitting: Internal Medicine

## 2023-03-29 ENCOUNTER — Ambulatory Visit: Payer: Self-pay | Admitting: Internal Medicine

## 2023-03-29 VITALS — BP 152/96 | HR 78 | Temp 97.6°F | Resp 18 | Ht 62.0 in | Wt 206.4 lb

## 2023-03-29 DIAGNOSIS — R253 Fasciculation: Secondary | ICD-10-CM

## 2023-03-29 DIAGNOSIS — R232 Flushing: Secondary | ICD-10-CM

## 2023-03-29 DIAGNOSIS — M62838 Other muscle spasm: Secondary | ICD-10-CM

## 2023-03-29 DIAGNOSIS — M2550 Pain in unspecified joint: Secondary | ICD-10-CM

## 2023-03-29 DIAGNOSIS — J310 Chronic rhinitis: Secondary | ICD-10-CM

## 2023-03-29 DIAGNOSIS — G4733 Obstructive sleep apnea (adult) (pediatric): Secondary | ICD-10-CM

## 2023-03-29 DIAGNOSIS — R030 Elevated blood-pressure reading, without diagnosis of hypertension: Secondary | ICD-10-CM

## 2023-03-29 MED ORDER — MOMETASONE FUROATE 50 MCG/ACT NA SUSP: 2.0000 | Freq: Every day | NASAL | 5 refills | Status: DC

## 2023-03-29 MED ORDER — AZELASTINE HCL 0.1 % NA SOLN: 2.0000 | Freq: Two times a day (BID) | NASAL | 5 refills | Status: DC | PRN

## 2023-03-29 MED ORDER — LEVOCETIRIZINE DIHYDROCHLORIDE 5 MG PO TABS: 5.0000 mg | ORAL_TABLET | Freq: Two times a day (BID) | ORAL | 5 refills | Status: DC

## 2023-03-29 NOTE — Patient Instructions (Addendum)
-   Will do workup for mast cell disease with tryptase and 24 hour urine studies.  Avoid any anti inflammatory/NSAIDs 48 hours prior to doing the urine collection. - Consider seeing a specialist at [facility].   - For now, start Xyzal  5mg  twice daily.  Chronic Rhinitis: - Use nasal saline rinses before nose sprays such as with Neilmed Sinus Rinse.  Use distilled water.   - Use Nasonex  or Nasacort 2 sprays each nostril daily. Aim upward and outward. - Use Azelastine  1-2 sprays each nostril twice daily as needed for runny nose, drainage, sneezing, congestion. Aim upward and outward.

## 2023-03-29 NOTE — Progress Notes (Signed)
 NEW PATIENT  Date of Service/Encounter:  03/29/23  Consult requested by: Melvenia Manus BRAVO, MD   Subjective:   Kimberly Rice (DOB: 1980/06/27) is a 43 y.o. female who presents to the clinic on 03/29/2023 with a chief complaint of concern for mast cell dx.  .    History obtained from: chart review and patient.   Reports ongoing symptoms for 20 years, worsen with perimenopause/post COVID. Has trouble with flushing, turning red/purple with burning that is worse on R side of face/chest.  Also having twitching episodes, aching of joints, muscle spasms.  Sensitive to scents like hand sanitizer, soaps, hot sauce, wine resulting in anaphylaxis.  Also having sensation of internal stabbing on the sides.  She has noted triggers with alcohol, pepper, exercise, sun exposure, eggs. Travels for a living.  Also using PRN Ketotifen/ASA/dye free benadryl/hydroxyzine .  Reports previously long acting anti histamines/Singulair /H2 blockers helping in the past but not anymore.  Has had improvement with progesterone with obgyn.  Also gets chronic congestion/drainage, worse at nighttime with CPAP mask. Flonase does not help.   Her BP is elevated today and reports meds have either not worked or she reacts to them in the past.    Reviewed:  10/17/2020: normal tryptase, elevated 24 hour histamine  of 407ug/24 hours.  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   09/08/2021: seen by Dr Sebastian Louanna Meeter and discussed obtaining urine studies with repeat tryptase and tryptase during acute episode as she does not meet criteria for MCAS.   08/11/2022: seen by Neurology Dr Rawland for memory impairement, tic disorder, paresthesia, myoclonus, s/p VP shunt.  Discussed repeat MRI, EEG.   Past Medical History: Past Medical History:  Diagnosis Date   Allergy     Anemia    Arthritis    Cancer (HCC)    skin   Hydrocephalus (HCC)    Hypertension    Neuromuscular disorder (HCC)    Ovarian cyst    Sleep  apnea     Past Surgical History: Past Surgical History:  Procedure Laterality Date   BRAIN SURGERY     VP shunt   BUNIONECTOMY     ENDOMETRIAL ABLATION W/ NOVASURE  06/07/2017   KNEE SURGERY     LEFT OOPHORECTOMY  11/24/2016   Cyst    Family History: Family History  Problem Relation Age of Onset   Diabetes Mother    Hypertension Mother    Arthritis Mother    Heart disease Mother    Prostate cancer Father    Cancer Father    Hearing loss Father    Heart attack Maternal Grandfather    Cancer Maternal Grandfather    Heart disease Maternal Grandfather    Cancer Paternal Grandfather    Diabetes Paternal Grandmother    Cancer Paternal Aunt    Cancer Paternal Uncle    COPD Maternal Aunt    Medication List:  Allergies as of 03/29/2023       Reactions   Wound Dressing Adhesive         Medication List        Accurate as of March 29, 2023 11:59 AM. If you have any questions, ask your nurse or doctor.          albuterol  108 (90 Base) MCG/ACT inhaler Commonly known as: VENTOLIN  HFA Can inhale two puffs every four to six hours as needed for cough or wheeze.   amLODipine  5 MG tablet Commonly known as: NORVASC  Take 1 tablet (5 mg  total) by mouth daily.   aspirin  EC 325 MG tablet Take 1 tablet (325 mg total) by mouth daily.   azelastine  0.1 % nasal spray Commonly known as: ASTELIN  Place 2 sprays into both nostrils 2 (two) times daily as needed. Use in each nostril as directed Started by: Arleta SHAUNNA Blanch   cyclobenzaprine 10 MG tablet Commonly known as: FLEXERIL TAKE 1 TABLET BY MOUTH THREE TIMES DAILY AS NEEDED FOR MUSCLE SPASM FOR 5 DAYS   EPINEPHrine  0.3 mg/0.3 mL Soaj injection Commonly known as: Auvi-Q  Inject 0.3 mg into the muscle as needed for anaphylaxis. As directed for life-threatening allergic reactions   famotidine  40 MG tablet Commonly known as: PEPCID  Take 1 tablet (40 mg total) by mouth 2 (two) times daily.   hydroxychloroquine 200 MG  tablet Commonly known as: PLAQUENIL Take 200 mg by mouth.   hydrOXYzine  10 MG tablet Commonly known as: ATARAX  Take 1-2 tablets 1-3 times daily as needed   KETOTIFEN FUMARATE OP Take 1 capsule by mouth 2 (two) times daily.   levocetirizine 5 MG tablet Commonly known as: XYZAL  Take 1 tablet (5 mg total) by mouth in the morning and at bedtime. Started by: Arleta SHAUNNA Blanch   mometasone  50 MCG/ACT nasal spray Commonly known as: Nasonex  Place 2 sprays into the nose daily. Started by: Arleta SHAUNNA Blanch   montelukast  10 MG tablet Commonly known as: SINGULAIR  Take 1 tablet (10 mg total) by mouth at bedtime.   olmesartan  20 MG tablet Commonly known as: BENICAR  Take 1 tablet (20 mg total) by mouth daily.   progesterone 100 MG capsule Commonly known as: PROMETRIUM Take 100 mg by mouth daily.         REVIEW OF SYSTEMS: Pertinent positives and negatives discussed in HPI.   Objective:   Physical Exam: BP (!) 152/96   Pulse 78   Temp 97.6 F (36.4 C)   Resp 18   Ht 5' 2 (1.575 m)   Wt 206 lb 6 oz (93.6 kg)   SpO2 96%   BMI 37.75 kg/m  Body mass index is 37.75 kg/m. GEN: alert, well developed HEENT: clear conjunctiva,  nose with + mild inferior turbinate hypertrophy, pink nasal mucosa, no rhinorrhea,  cobblestoning HEART: regular rate and rhythm, no murmur LUNGS: clear to auscultation bilaterally, no coughing, unlabored respiration ABDOMEN: soft, non distended  SKIN: no apparent rashes or lesions   Assessment:   1. Chronic rhinitis   2. Flushing   3. Arthralgia, unspecified joint   4. Muscle spasm   5. Twitching   6. Elevated blood pressure reading   7. OSA on CPAP     Plan/Recommendations:  Flushing, Arthralgia, Muscle Spasm, Twitching - I am not sure if these symptoms are indicative of MCAS.  Prior workup was also not indicative of MCAS so discussed redoing the workup.   - Will do workup for mast cell disease with tryptase and 24 hour urine studies.  Avoid any  anti inflammatory/NSAIDs 48-72 hours prior to doing the urine collection. - Consider seeing a specialist at [facility].   - For now, start Xyzal  5mg  twice daily.  Chronic Rhinitis: - Use nasal saline rinses before nose sprays such as with Neilmed Sinus Rinse.  Use distilled water.   - Use Nasonex  or Nasacort 2 sprays each nostril daily. Aim upward and outward. - Use Azelastine  1-2 sprays each nostril twice daily as needed for runny nose, drainage, sneezing, congestion. Aim upward and outward.  Elevated BP - Discussed follow  up with PCP.  Agree with lifestyle measurements as healthy diet and exercise can help with control.   OSA on CPAP - Can worsen rhinitis.  Use sinus regimen prior to CPAP use each night.   Return if symptoms worsen or fail to improve.  Arleta Blanch, MD [facility] 

## 2023-03-30 ENCOUNTER — Other Ambulatory Visit: Payer: Self-pay

## 2023-03-30 ENCOUNTER — Inpatient Hospital Stay
Admission: RE | Admit: 2023-03-30 | Discharge: 2023-03-30 | Disposition: A | Discharge status: Home / Self Care | Payer: Self-pay | Source: Ambulatory Visit | Attending: Neurosurgery

## 2023-03-30 ENCOUNTER — Ambulatory Visit (INDEPENDENT_AMBULATORY_CARE_PROVIDER_SITE_OTHER): Payer: Self-pay | Admitting: Neurosurgery

## 2023-03-30 VITALS — BP 128/82 | Ht 62.0 in | Wt 209.0 lb

## 2023-03-30 DIAGNOSIS — Z049 Encounter for examination and observation for unspecified reason: Secondary | ICD-10-CM

## 2023-03-30 DIAGNOSIS — G918 Other hydrocephalus: Secondary | ICD-10-CM

## 2023-03-30 DIAGNOSIS — Q039 Congenital hydrocephalus, unspecified: Secondary | ICD-10-CM

## 2023-03-30 DIAGNOSIS — Z982 Presence of cerebrospinal fluid drainage device: Secondary | ICD-10-CM

## 2023-03-31 NOTE — Addendum Note (Signed)
 Addended by: Ernie Hew on: 03/31/2023 10:21 AM   Modules accepted: Orders

## 2023-04-05 ENCOUNTER — Ambulatory Visit: Payer: No Typology Code available for payment source | Admitting: Internal Medicine

## 2023-04-12 ENCOUNTER — Ambulatory Visit
Admission: RE | Admit: 2023-04-12 | Discharge: 2023-04-12 | Disposition: A | Discharge status: Home / Self Care | Payer: Self-pay | Source: Ambulatory Visit | Attending: Neurosurgery | Admitting: Neurosurgery

## 2023-04-12 DIAGNOSIS — Z982 Presence of cerebrospinal fluid drainage device: Secondary | ICD-10-CM | POA: Insufficient documentation

## 2023-04-12 DIAGNOSIS — G918 Other hydrocephalus: Secondary | ICD-10-CM | POA: Insufficient documentation

## 2023-05-04 ENCOUNTER — Encounter: Payer: Self-pay | Admitting: Neurosurgery

## 2023-05-04 ENCOUNTER — Ambulatory Visit (INDEPENDENT_AMBULATORY_CARE_PROVIDER_SITE_OTHER): Payer: Self-pay | Admitting: Neurosurgery

## 2023-05-04 VITALS — BP 138/86 | Ht 62.0 in | Wt 209.0 lb

## 2023-05-04 DIAGNOSIS — Z982 Presence of cerebrospinal fluid drainage device: Secondary | ICD-10-CM

## 2023-05-04 DIAGNOSIS — G918 Other hydrocephalus: Secondary | ICD-10-CM

## 2023-05-04 NOTE — Progress Notes (Signed)
Referring Physician:  Billie Lade, MD [address] [place],  [place] [address]  Primary Physician:  Billie Lade, MD  History of Present Illness: 05/04/2023 Kimberly Rice presents today for review of her CT and xrays.  She had some concerns regarding her imaging reports which note a programmable valve and a history of cholecystectomy.  Her valve is nonprogrammable and she has never had her gallbladder removed.  03/28/2023 Kimberly Rice is here today to establish care with neurosurgery.  She had a history of a nonprogrammable VP shunt placed at 37 weeks old.  She states that she had 1 catheter revision as a teenager for lengthening however she has not had any history of shunt failure or requirement for shunt revision.  She has not had any significant changes in her balance or acute cognitive changes although she does express a longstanding history of intermittent cognitive trouble and decline.  She also notes having swelling around her shunt valve a couple of weeks ago with tenderness around that area.  There is no fever or drainage and this has resolved on its own.   Past Surgery: Brain surgery VP shunt several years ago  Kimberly Rice has no symptoms of cervical myelopathy.  The symptoms are causing a significant impact on the patient's life.   Review of Systems:  A 10 point review of systems is negative, except for the pertinent positives and negatives detailed in the HPI.  Past Medical History: Past Medical History:  Diagnosis Date   Allergy    Anemia    Arthritis    Cancer (HCC)    skin   Hydrocephalus (HCC)    Hypertension    Neuromuscular disorder (HCC)    Ovarian cyst    Sleep apnea     Past Surgical History: Past Surgical History:  Procedure Laterality Date   BRAIN SURGERY     VP shunt   BUNIONECTOMY     ENDOMETRIAL ABLATION W/ NOVASURE  06/07/2017   KNEE SURGERY     LEFT OOPHORECTOMY  11/24/2016   Cyst    Allergies: Allergies as of  05/04/2023 - Review Complete 03/30/2023  Allergen Reaction Noted   Wound dressing adhesive  06/17/2020    Medications: Outpatient Encounter Medications as of 05/04/2023  Medication Sig   albuterol (VENTOLIN HFA) 108 (90 Base) MCG/ACT inhaler Can inhale two puffs every four to six hours as needed for cough or wheeze.   aspirin EC 325 MG tablet Take 1 tablet (325 mg total) by mouth daily.   azelastine (ASTELIN) 0.1 % nasal spray Place 2 sprays into both nostrils 2 (two) times daily as needed. Use in each nostril as directed   cyclobenzaprine (FLEXERIL) 10 MG tablet TAKE 1 TABLET BY MOUTH THREE TIMES DAILY AS NEEDED FOR MUSCLE SPASM FOR 5 DAYS   EPINEPHrine (AUVI-Q) 0.3 mg/0.3 mL IJ SOAJ injection Inject 0.3 mg into the muscle as needed for anaphylaxis. As directed for life-threatening allergic reactions   famotidine (PEPCID) 40 MG tablet Take 1 tablet (40 mg total) by mouth 2 (two) times daily.   hydroxychloroquine (PLAQUENIL) 200 MG tablet Take 200 mg by mouth.   hydrOXYzine (ATARAX/VISTARIL) 10 MG tablet Take 1-2 tablets 1-3 times daily as needed   KETOTIFEN FUMARATE OP Take 1 capsule by mouth 2 (two) times daily.   levocetirizine (XYZAL) 5 MG tablet Take 1 tablet (5 mg total) by mouth in the morning and at bedtime.   mometasone (NASONEX) 50 MCG/ACT nasal spray  Place 2 sprays into the nose daily.   progesterone (PROMETRIUM) 100 MG capsule Take 100 mg by mouth daily.   No facility-administered encounter medications on file as of 05/04/2023.    Social History: Social History   Tobacco Use   Smoking status: Never   Smokeless tobacco: Never  Substance Use Topics   Alcohol use: Never   Drug use: Never    Family Medical History: Family History  Problem Relation Age of Onset   Diabetes Mother    Hypertension Mother    Arthritis Mother    Heart disease Mother    Prostate cancer Father    Cancer Father    Hearing loss Father    Heart attack Maternal Grandfather    Cancer Maternal  Grandfather    Heart disease Maternal Grandfather    Cancer Paternal Grandfather    Diabetes Paternal Grandmother    Cancer Paternal Aunt    Cancer Paternal Uncle    COPD Maternal Aunt     Physical Examination: There were no vitals filed for this visit.  There is no height or weight on file to calculate BMI.   General: Patient is well developed, well nourished, calm, collected, and in no apparent distress. Attention to examination is appropriate.  Psychiatric: Patient is non-anxious.  Head:  Pupils equal, round, and reactive to light.  ENT:  Oral mucosa appears well hydrated.  Neck:   Supple.   Respiratory: Patient is breathing without any difficulty.  Extremities: No edema.  Vascular: Palpable dorsal pedal pulses.  Skin:   On exposed skin, there are no abnormal skin lesions.  NEUROLOGICAL:     Awake, alert, oriented to person, place, and time.  Speech is clear and fluent. Fund of knowledge is appropriate.   Cranial Nerves: Pupils equal round and reactive to light.  Facial tone is symmetric.  Facial sensation is symmetric.    Strength: Side Biceps Triceps Deltoid Interossei Grip Wrist Ext. Wrist Flex.  R 5 5 5 5 5 5 5   L 5 5 5 5 5 5 5    Side Iliopsoas Quads Hamstring PF DF EHL  R 5 5 5 5 5 5   L 5 5 5 5 5 5    Reflexes are 2+ and symmetric at the biceps, triceps, brachioradialis, patella and achilles.   Hoffman's is absent.  Clonus is not present.  Toes are down-going.  Bilateral upper and lower extremity sensation is intact to light touch.    Gait is normal.   No difficulty with tandem gait.   No evidence of dysmetria noted.  Shunt valve pumps and refills  Medical Decision Making  Imaging: CT head 04/12/23 FINDINGS: Brain: Normal background cerebral volume. Chronic asymmetry of the lateral ventricles without ventriculomegaly. Right posterior approach ventriculostomy catheter appears to communicate with the right lateral ventricle which is smaller on  coronal image 37, terminating in the midline and stable compared to 2012.   No midline shift, mass effect, evidence of mass lesion, intracranial hemorrhage or evidence of cortically based acute infarction. Basilar cisterns appear normal. No extra-axial fluid collection. Gray-white matter differentiation is within normal limits throughout the brain. No encephalomalacia identified.   Vascular: No suspicious intracranial vascular hyperdensity.   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 Otherwise intact.   Sinuses/Orbits: Visualized paranasal sinuses and mastoids are clear.   Other: Chronic right posterior convexity shunt reservoir and tubing not significantly changed since 2012, no surrounding soft tissue inflammation. Visualized orbit soft tissues are within normal  limits.   IMPRESSION: Stable since 2012. Chronic right posterior approach CSF shunt with no ventriculomegaly or adverse features.     Electronically Signed   By: Odessa Fleming M.D.   On: 04/22/2023 09:05  Shunt series 04/12/23 IMPRESSION: 1. Intact cranial and visualized cervical portions of the right ventriculoperitoneal shunt.  IMPRESSION: Intact cervical portion of the ventriculoperitoneal shunt.   IMPRESSION: 1. Intact visualized portion of the right-sided ventriculoperitoneal shunt catheter. 2. No active cardiopulmonary disease.   IMPRESSION: Intact abdominal portion of the ventriculoperitoneal shunt catheter.  Assessment and Plan: Kimberly Rice is a pleasant 43 y.o. 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 She presents today for review of her CT head and shunt series. These in combination with her exam and lack of symptoms is reassuring.  I reach out to radiology regarding her imaging reports to see if these can be addended.  She would like to have regular follow-up with neurosurgery to remain an active patient.  I  recommended follow-up in 18 to 24 months to review her symptoms.  She does not necessarily need imaging unless she is symptomatic at this time.  She was encouraged to call our office should she have any questions in the interim.    Thank you for involving me in the care of this patient.   I spent a total of 45 minutes in both face-to-face and non-face-to-face activities for this visit on the date of this encounter including review of records, review of imaging, discussion of symptoms, discussion of plan of care, and documentation.  [facility]

## 2023-08-23 ENCOUNTER — Telehealth: Payer: Self-pay

## 2023-08-23 NOTE — Telephone Encounter (Signed)
 Copied from CRM [id number]. Topic: Appointments - Appointment Scheduling >> Aug 23, 2023  4:35 PM Kimberly Rice wrote: Patient is calling in because she needs to schedule an appointment with Dr. Kermit Ped for her iron. Schedule is saying no solution found without overruling.

## 2023-08-24 NOTE — Telephone Encounter (Signed)
 LVM to inform pt that Dr Kermit Ped has no availability before his last day seeing patients 6/17 but she is welcome to see Alison Irvine, NP.

## 2023-08-25 ENCOUNTER — Ambulatory Visit (HOSPITAL_COMMUNITY)
Admission: RE | Admit: 2023-08-25 | Discharge: 2023-08-25 | Disposition: A | Discharge status: Home / Self Care | Payer: Self-pay | Source: Ambulatory Visit

## 2023-08-25 ENCOUNTER — Other Ambulatory Visit: Payer: Self-pay | Admitting: *Deleted

## 2023-08-25 ENCOUNTER — Ambulatory Visit (INDEPENDENT_AMBULATORY_CARE_PROVIDER_SITE_OTHER): Payer: Self-pay

## 2023-08-25 VITALS — BP 154/88 | HR 80 | Ht 62.0 in | Wt 209.0 lb

## 2023-08-25 DIAGNOSIS — D509 Iron deficiency anemia, unspecified: Secondary | ICD-10-CM

## 2023-08-25 DIAGNOSIS — M79672 Pain in left foot: Secondary | ICD-10-CM | POA: Insufficient documentation

## 2023-08-25 DIAGNOSIS — M797 Fibromyalgia: Secondary | ICD-10-CM

## 2023-08-25 DIAGNOSIS — M79671 Pain in right foot: Secondary | ICD-10-CM | POA: Insufficient documentation

## 2023-08-25 NOTE — Progress Notes (Unsigned)
   Established Patient Office Visit  Subjective   Patient ID: Kimberly Rice, female    DOB: Apr 04, 1980  Age: 43 y.o. MRN: [id number]  Chief Complaint  Patient presents with   Medical Management of Chronic Issues    Pt here states "Wants to talk about her iron, and pain in heels"    HPI  {History (Optional):23778}  ROS    Objective:     BP (!) 154/88   Pulse 80   Ht 5\' 2"  (1.575 m)   Wt 209 lb (94.8 kg)   SpO2 97%   BMI 38.23 kg/m  {Vitals History (Optional):23777}  Physical Exam   No results found for any visits on 08/25/23.  {Labs (Optional):23779}  The ASCVD Risk score (Arnett DK, et al., 2019) failed to calculate for the following reasons:   Cannot find a previous HDL lab   Cannot find a previous total cholesterol lab    Assessment & Plan:   Problem List Items Addressed This Visit   None   No follow-ups on file.    Alison Irvine, FNP

## 2023-08-25 NOTE — Patient Instructions (Signed)
 Come back at 1 today for labs x-ray of right foot  Recommend OTC topical Voltaren gel for foot pain.

## 2023-08-26 ENCOUNTER — Ambulatory Visit: Payer: Self-pay

## 2023-08-26 DIAGNOSIS — M797 Fibromyalgia: Secondary | ICD-10-CM | POA: Insufficient documentation

## 2023-08-26 DIAGNOSIS — D509 Iron deficiency anemia, unspecified: Secondary | ICD-10-CM | POA: Insufficient documentation

## 2023-08-26 DIAGNOSIS — M79671 Pain in right foot: Secondary | ICD-10-CM | POA: Insufficient documentation

## 2023-08-26 NOTE — Assessment & Plan Note (Addendum)
 Suspect plantar fasciitis.  Will obtain an x-ray of both feet for further evaluation.  She had extensive blood work done through outside independent lab.  Results were reviewed uric acid was negative.  Recommend topical Voltaren gel and ice to affected area and stretching exercises.

## 2023-08-26 NOTE — Assessment & Plan Note (Signed)
 She is frustrated with lack of progress in finding a cause for her ongoing muscle weakness and Teague.  Again she had extensive labs drawn from outside independent lab which were unrevealing her inflammatory markers have been elevated but her rheumatologist states she does not have lupus.  Lyme titer was negative, however she states her brother has long-term Lyme disease and there are different strains to test for.  We will test for myasthenia gravis today, as I do not see where this has been checked in the past.  Recommend follow-up with rheumatologist for ongoing evaluation

## 2023-08-26 NOTE — Assessment & Plan Note (Signed)
 Her iron levels remain low, even though she does not have a period due to previous ablation, and has been taking iron supplementation and eating rich in iron and vitamin C.  She also reports having previous colonoscopy.  Agree to referral to hematology for further evaluation.

## 2023-08-27 LAB — ACETYLCHOLINE RECEPTOR, BINDING: AChR Binding Ab, Serum: <0.07 nmol/L (ref 0.00–0.24)

## 2023-11-01 ENCOUNTER — Encounter: Payer: Self-pay | Admitting: Family Medicine

## 2024-03-02 ENCOUNTER — Ambulatory Visit: Payer: Self-pay

## 2024-03-02 NOTE — Telephone Encounter (Signed)
 FYI Only or Action Required?: FYI only for provider: appointment scheduled on 12/18.  Patient was last seen in primary care on 08/25/2023 by Bevely Doffing, FNP.  Called Nurse Triage reporting Hand Pain and Rash.  Symptoms began several weeks ago.  Interventions attempted: Rest, hydration, or home remedies.  Symptoms are: gradually worsening.  Triage Disposition: See PCP When Office is Open (Within 3 Days)  Patient/caregiver understands and will follow disposition?: Yes      Copied from CRM #[id number]. Topic: Clinical - Red Word Triage >> Mar 02, 2024  4:07 PM Roselie BROCKS wrote: Red Word that prompted transfer to Nurse Triage: Patients states she is having a hard time using both hands, they are very painful .and she has a rash, Reason for Disposition  [1] MODERATE pain (e.g., interferes with normal activities) AND [2] present > 3 days  Answer Assessment - Initial Assessment Questions 1. ONSET: When did the pain start?     Ongoing for years, getting progressively worse over the past few weeks  2. LOCATION: Where is the pain located?     BIL hands   3. PAIN: How bad is the pain? (Scale 1-10; or mild, moderate, severe)     Moderate  4. WORK OR EXERCISE: Has there been any recent work or exercise that involved this part (i.e., hand or wrist) of the body?     No   5. CAUSE: What do you think is causing the pain?     Fibromyalgia    6. AGGRAVATING FACTORS: What makes the pain worse? (e.g., using computer)     Using hand with daily activity/movements   7. OTHER SYMPTOMS: Do you have any other symptoms? (e.g., fever, neck pain, numbness or tingling, rash, swelling)     Rash on left shoulder  Patient called into triage stating,  she has fibromyalgia. She is being seen by a rheumatologist but is currently seeking a new provider. She is having a hard time using her hands, and gripping items. There is a Rash on her left shoulder which appeared 2 days ago. The area is  red, scaly, with tiny bumps. No fever noted, no itch present. She suspects a rheumatoid flare up or fibromyalgia flare up. She would like to follow up with PCP's office until paperwork, etc. Has been received by rheumatology office. Pt. Was scheduled for the soonest avail. Appt. Per Epic on 12/18. A Patient agrees with plan of care, and will call back if anything changes, or if symptoms worsen.  Protocols used: Hand Pain-A-AH

## 2024-03-02 NOTE — Telephone Encounter (Signed)
Noted patient scheduled

## 2024-03-06 ENCOUNTER — Ambulatory Visit: Payer: Self-pay | Admitting: Internal Medicine

## 2024-03-06 ENCOUNTER — Encounter: Payer: Self-pay | Admitting: Internal Medicine

## 2024-03-06 VITALS — BP 168/84 | HR 85 | Ht 62.0 in | Wt 206.0 lb

## 2024-03-06 DIAGNOSIS — R7982 Elevated C-reactive protein (CRP): Secondary | ICD-10-CM

## 2024-03-06 DIAGNOSIS — R7689 Other specified abnormal immunological findings in serum: Secondary | ICD-10-CM | POA: Insufficient documentation

## 2024-03-06 DIAGNOSIS — B354 Tinea corporis: Secondary | ICD-10-CM

## 2024-03-06 DIAGNOSIS — L304 Erythema intertrigo: Secondary | ICD-10-CM | POA: Insufficient documentation

## 2024-03-06 DIAGNOSIS — I10 Essential (primary) hypertension: Secondary | ICD-10-CM

## 2024-03-06 DIAGNOSIS — M2550 Pain in unspecified joint: Secondary | ICD-10-CM | POA: Insufficient documentation

## 2024-03-06 MED ORDER — CLOTRIMAZOLE-BETAMETHASONE 1-0.05 % EX CREA: 1.0000 | TOPICAL_CREAM | Freq: Every day | CUTANEOUS | 1 refills | Status: AC

## 2024-03-06 MED ORDER — AMLODIPINE-OLMESARTAN 10-40 MG PO TABS: 1.0000 | ORAL_TABLET | Freq: Every day | ORAL | 1 refills | Status: AC

## 2024-03-06 MED ORDER — PREDNISONE 10 MG (21) PO TBPK: ORAL_TABLET | ORAL | 0 refills | Status: AC

## 2024-03-06 NOTE — Patient Instructions (Signed)
 Please start taking Azor  10-40 mg once daily.  Please start taking Prednisone  as prescribed.  Please apply Lotrisone  cream over the rash.

## 2024-03-06 NOTE — Assessment & Plan Note (Signed)
 Unclear if related to autoimmune arthritis Sterapred taper prescribed Referred to [facility] rheumatology

## 2024-03-06 NOTE — Progress Notes (Signed)
 Acute Office Visit  Subjective:    Patient ID: Kimberly Rice, female    DOB: 12/30/1980, 43 y.o.   MRN: [id number]  Chief Complaint  Patient presents with   Joint Pain    Daily tasks are extremely painful due to joint pain    Rash    Rash on the shoulder     HPI Patient is in today for complaint of recent worsening of right wrist, hand and left hip pain for the last 1 month.  Her pain has been constant, progressive, sharp, nonradiating and worse with movement.  She has right hand and wrist swelling, which is intermittent.  She has been evaluated by [facility]. Of note she gets blood tests done through functional medicine clinic, which showed elevated CRP, ANA, but has not been told of any autoimmune condition.  She has been given trial of prednisone  in the past, to which she responded very well.  She also reports a rash on her left shoulder for the last 1 week.  She has red, circular rash, which is not itching currently.  Denies any fever or chills.     Past Medical History:  Diagnosis Date   Allergy     Anemia    Arthritis    Cancer (HCC)    skin   Hydrocephalus (HCC)    Hypertension    Neuromuscular disorder (HCC)    Ovarian cyst    Sleep apnea     Past Surgical History:  Procedure Laterality Date   BRAIN SURGERY     VP shunt   BUNIONECTOMY     ENDOMETRIAL ABLATION W/ NOVASURE  06/07/2017   KNEE SURGERY     LEFT OOPHORECTOMY  11/24/2016   Cyst    Family History  Problem Relation Age of Onset   Diabetes Mother    Hypertension Mother    Arthritis Mother    Heart disease Mother    Prostate cancer Father    Cancer Father    Hearing loss Father    Heart attack Maternal Grandfather    Cancer Maternal Grandfather    Heart disease Maternal Grandfather    Cancer Paternal Grandfather    Diabetes Paternal Grandmother    Cancer Paternal Aunt    Cancer Paternal Uncle    COPD Maternal Aunt     Social History   Socioeconomic History    Marital status: Married    Spouse name: Not on file   Number of children: Not on file   Years of education: Not on file   Highest education level: Not on file  Occupational History   Not on file  Tobacco Use   Smoking status: Never   Smokeless tobacco: Never  Substance and Sexual Activity   Alcohol use: Never   Drug use: Never   Sexual activity: Yes    Birth control/protection: Surgical  Other Topics Concern   Not on file  Social History Narrative   Not on file   Social Drivers of Health   Tobacco Use: Low Risk (03/06/2024)   Patient History    Smoking Tobacco Use: Never    Smokeless Tobacco Use: Never    Passive Exposure: Not on file  Financial Resource Strain: Low Risk (03/06/2024)   Overall Financial Resource Strain (CARDIA)    Difficulty of Paying Living Expenses: Not hard at all  Food Insecurity: No Food Insecurity (03/06/2024)   Epic    Worried About Radiation Protection Practitioner of Food in the Last Year: Never true  Ran Out of Food in the Last Year: Never true  Transportation Needs: No Transportation Needs (03/06/2024)   Epic    Lack of Transportation (Medical): No    Lack of Transportation (Non-Medical): No  Physical Activity: Insufficiently Active (03/06/2024)   Exercise Vital Sign    Days of Exercise per Week: 3 days    Minutes of Exercise per Session: 40 min  Stress: No Stress Concern Present (03/06/2024)   [employer] of Occupational Health - Occupational Stress Questionnaire    Feeling of Stress: Not at all  Social Connections: Unknown (03/06/2024)   Social Connection and Isolation Panel    Frequency of Communication with Friends and Family: More than three times a week    Frequency of Social Gatherings with Friends and Family: Once a week    Attends Religious Services: Patient declined    Database Administrator or Organizations: Patient declined    Attends Banker Meetings: Not on file    Marital Status: Married  Intimate Partner Violence: Not on  file  Depression (PHQ2-9): Low Risk (08/25/2023)   Depression (PHQ2-9)    PHQ-2 Score: 0  Alcohol Screen: Low Risk (03/06/2024)   Alcohol Screen    Last Alcohol Screening Score (AUDIT): 1  Housing: Unknown (03/06/2024)   Epic    Unable to Pay for Housing in the Last Year: No    Number of Times Moved in the Last Year: Not on file    Homeless in the Last Year: No  Utilities: Not on file  Health Literacy: Not on file    Outpatient Medications Prior to Visit  Medication Sig Dispense Refill   olmesartan  (BENICAR ) 20 MG tablet Take 1 tablet by mouth daily.     progesterone (PROMETRIUM) 100 MG capsule Take 100 mg by mouth daily.     No facility-administered medications prior to visit.    Allergies[1]  Review of Systems  Constitutional:  Positive for fatigue. Negative for chills and fever.  HENT:  Negative for congestion, sinus pressure, sinus pain and sore throat.   Eyes:  Negative for pain and discharge.  Respiratory:  Negative for cough and shortness of breath.   Cardiovascular:  Negative for chest pain and palpitations.  Gastrointestinal:  Negative for abdominal pain, diarrhea, nausea and vomiting.  Endocrine: Negative for polydipsia and polyuria.  Genitourinary:  Negative for dysuria and hematuria.  Musculoskeletal:  Positive for arthralgias. Negative for neck pain and neck stiffness.  Skin:  Negative for rash.  Neurological:  Negative for dizziness and weakness.  Psychiatric/Behavioral:  Negative for agitation and behavioral problems.        Objective:    Physical Exam Vitals reviewed.  Constitutional:      General: She is not in acute distress.    Appearance: She is not diaphoretic.  HENT:     Head: Normocephalic and atraumatic.     Nose: Nose normal.     Mouth/Throat:     Mouth: Mucous membranes are moist.     Pharynx: No posterior oropharyngeal erythema.  Eyes:     General: No scleral icterus.    Extraocular Movements: Extraocular movements intact.     Pupils:  Pupils are equal, round, and reactive to light.  Cardiovascular:     Rate and Rhythm: Normal rate and regular rhythm.     Heart sounds: Normal heart sounds. No murmur heard. Pulmonary:     Breath sounds: Normal breath sounds. No wheezing or rales.  Musculoskeletal:     Right wrist:  Swelling present. Decreased range of motion.     Right hand: Swelling present. Decreased strength.     Cervical back: Neck supple. No tenderness.     Right lower leg: No edema.     Left lower leg: No edema.  Skin:    General: Skin is warm.     Findings: Rash (About 3 cm in diameter, circular patch over left shoulder area) present.  Neurological:     General: No focal deficit present.     Mental Status: She is alert and oriented to person, place, and time.  Psychiatric:        Mood and Affect: Mood normal.        Behavior: Behavior normal.     BP (!) 180/84   Pulse 85   Ht 5' 2 (1.575 m)   Wt 206 lb (93.4 kg)   SpO2 98%   BMI 37.68 kg/m  Wt Readings from Last 3 Encounters:  03/06/24 206 lb (93.4 kg)  08/25/23 209 lb (94.8 kg)  05/04/23 209 lb (94.8 kg)        Assessment & Plan:   Assessment & Plan Polyarthralgia She has diffuse arthralgias, which is recurrent, but has been progressive recently Started Sterapred taper Blood tests reviewed from patient's device - had it through functional health clinic - had elevated CRP, ANA, but negative rheumatoid factor, anti-CCP antibody, SSA and SSB antibody Referred to [facility] rheumatology for further evaluation and second opinion Essential hypertension BP Readings from Last 1 Encounters:  03/06/24 (!) 168/84   Uncontrolled Started Azor  10-40 mg QD Counseled for compliance with the medications Advised DASH diet and moderate exercise/walking, at least 150 mins/week  Elevated C-reactive protein (CRP) Unclear if related to autoimmune arthritis Sterapred taper prescribed Referred to [facility] rheumatology Tinea corporis Rash on left  shoulder area likely due to tenia corporis Lotrisone  cream prescribed Elevated antinuclear antibody (ANA) level Has speckled pattern Other autoimmune antibody levels were WNL as reported above through functional clinic Referred to [facility] rheumatology for further evaluation       No orders of the defined types were placed in this encounter.    Suzzane MARLA Blanch, MD     [1]  Allergies Allergen Reactions   Wound Dressing Adhesive

## 2024-03-06 NOTE — Assessment & Plan Note (Signed)
 Rash on left shoulder area likely due to tenia corporis Lotrisone  cream prescribed

## 2024-03-06 NOTE — Assessment & Plan Note (Signed)
 Has speckled pattern Other autoimmune antibody levels were WNL as reported above through functional clinic Referred to [facility] rheumatology for further evaluation

## 2024-03-06 NOTE — Assessment & Plan Note (Signed)
 BP Readings from Last 1 Encounters:  03/06/24 (!) 168/84   Uncontrolled Started Azor  10-40 mg QD Counseled for compliance with the medications Advised DASH diet and moderate exercise/walking, at least 150 mins/week

## 2024-03-06 NOTE — Assessment & Plan Note (Signed)
 She has diffuse arthralgias, which is recurrent, but has been progressive recently Started Sterapred taper Blood tests reviewed from patient's device - had it through functional health clinic - had elevated CRP, ANA, but negative rheumatoid factor, anti-CCP antibody, SSA and SSB antibody Referred to [facility] rheumatology for further evaluation and second opinion

## 2024-03-09 ENCOUNTER — Ambulatory Visit: Payer: Self-pay | Admitting: Internal Medicine

## 2024-03-21 NOTE — Addendum Note (Signed)
 Encounter addended by: Val Schiavo L on: 03/21/2024 10:32 AM  Actions taken: Imaging Exam ended

## 2024-03-22 NOTE — Addendum Note (Signed)
 Encounter addended by: Blimie Vaness L on: 03/22/2024 3:24 PM  Actions taken: Imaging Exam ended

## 2024-04-26 ENCOUNTER — Ambulatory Visit: Payer: Self-pay

## 2024-04-26 NOTE — Telephone Encounter (Signed)
 noted

## 2024-04-26 NOTE — Telephone Encounter (Signed)
 FYI Only or Action Required?: FYI only for provider: appointment scheduled on 04/27/24.  Patient was last seen in primary care on 03/06/2024 by Tobie Suzzane POUR, MD.  Called Nurse Triage reporting Otalgia.  Symptoms began several weeks ago.  Interventions attempted: Nothing.  Symptoms are: unchanged.  Triage Disposition: See Physician Within 24 Hours  Patient/caregiver understands and will follow disposition?: Yes   Reason for Disposition  Earache  (Exceptions: Brief ear pain of lasting less than 60 minutes, or earache occurring during air travel.)  Answer Assessment - Initial Assessment Questions Pt is also seeing a rheumatologist. Pt reports she has ear issues that usually flare up with allergies, but described it as feeling like something was out of place in her ear.   1. LOCATION: Which ear is involved?     Both ears, right ear is worse  2. ONSET: When did the ear pain start?      Couple weeks ago  3. SEVERITY: How bad is the pain?  (Scale 1-10; mild, moderate or severe)     Severe stabbing pain, subsided but its a constant low level ache   4. URI SYMPTOMS: Do you have a runny nose or cough?     Runny nose, normal with winter / allergies  5. FEVER: Do you have a fever? If Yes, ask: What is your temperature, how was it measured, and when did it start?     Denies  6. CAUSE: Have you been swimming recently?, How often do you use Q-TIPS?, Have you had any recent air travel or scuba diving?     Unsure  7. OTHER SYMPTOMS: Do you have any other symptoms? (e.g., decreased hearing, dizziness, headache, stiff neck, vomiting)     Neck stiffness, having an autoimmune flare  Protocols used: Earache-A-AH  Message from Tiffini S sent at 04/26/2024  3:08 PM EST  Reason for Triage: Patient is having extreme ear pain- was seen with Dr. Tobie on 03/06/24

## 2024-04-27 ENCOUNTER — Encounter: Payer: Self-pay | Admitting: Family Medicine

## 2024-04-27 ENCOUNTER — Ambulatory Visit: Payer: Self-pay | Admitting: Family Medicine

## 2024-04-27 VITALS — BP 150/85 | HR 68 | Temp 97.6°F | Ht 62.0 in | Wt 219.0 lb

## 2024-04-27 DIAGNOSIS — I10 Essential (primary) hypertension: Secondary | ICD-10-CM

## 2024-04-27 DIAGNOSIS — M797 Fibromyalgia: Secondary | ICD-10-CM

## 2024-04-27 DIAGNOSIS — H6593 Unspecified nonsuppurative otitis media, bilateral: Secondary | ICD-10-CM

## 2024-04-27 DIAGNOSIS — M26609 Unspecified temporomandibular joint disorder, unspecified side: Secondary | ICD-10-CM

## 2024-04-27 MED ORDER — CEFPROZIL 500 MG PO TABS: 500.0000 mg | ORAL_TABLET | Freq: Two times a day (BID) | ORAL | 0 refills | Status: AC

## 2024-04-27 MED ORDER — NABUMETONE 500 MG PO TABS: 1000.0000 mg | ORAL_TABLET | Freq: Two times a day (BID) | ORAL | 1 refills | Status: AC

## 2024-04-27 NOTE — Progress Notes (Unsigned)
 "  Subjective:  Patient ID: Kimberly Rice, female    DOB: [birth year]  Age: 44 y.o. MRN: [id number]  CC: Ear Pain   HPI  Discussed the use of AI scribe software for clinical note transcription with the patient, who gave verbal consent to proceed.  History of Present Illness Kimberly Rice is a 44 year old female who presents with jaw and ear pain following a recent flare of unknown etiology.  She has been experiencing severe, stabbing jaw and ear pain for several weeks, located in front of her ear. The pain began after a flare around Christmas, during which she had severe joint pain throughout her body. The jaw pain was so intense at times that she was unable to move her jaw or sip water. She also reports pressure in both ears since the onset of the jaw pain.  She has a history of severe lymphedema, which was exacerbated by a blood pressure medication prescribed a few weeks ago, leading her to discontinue its use. She took a diuretic for three days to manage the lymphedema.  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 She has been unable to find a doctor confident enough to treat this condition. She also reports a history of leg pain since puberty.  No fever, but her temperature has been low, typically running between 96 and 97 degrees Fahrenheit. Her white blood cell count has been elevated for some time.  She has been on multiple blood pressure medications, none of which have been effective or tolerable due to side effects. She does not have insurance and has spent significant amounts out of pocket for medical care.          04/27/2024   11:55 AM 08/25/2023   11:25 AM 10/05/2022    2:00 PM  Depression screen PHQ 2/9  Decreased Interest 0 0 0  Down, Depressed, Hopeless 0 0 0  PHQ - 2 Score 0 0 0  Altered sleeping 0 0 0  Tired, decreased energy 3 0 0  Change in appetite 0 0 0  Feeling bad or failure  about yourself  0 0 0  Trouble concentrating 3 0 0  Moving slowly or fidgety/restless 0 0 0  Suicidal thoughts 0 0 0  PHQ-9 Score 6 0  0   Difficult doing work/chores Somewhat difficult Not difficult at all      Data saved with a previous flowsheet row definition    History Kimberly Rice has a past medical history of Allergy , Anemia, Arthritis, Cancer (HCC), Hydrocephalus (HCC), Hypertension, Neuromuscular disorder (HCC), Ovarian cyst, and Sleep apnea.   She has a past surgical history that includes Bunionectomy; Knee surgery; Endometrial ablation w/ novasure (06/07/2017); Left oophorectomy (11/24/2016); and Brain surgery.   Her family history includes Arthritis in her mother; COPD in her maternal aunt; Cancer in her father, maternal grandfather, paternal aunt, paternal grandfather, and paternal uncle; Diabetes in her mother and paternal grandmother; Hearing loss in her father; Heart attack in her maternal grandfather; Heart disease in her maternal grandfather and mother; Hypertension in her mother; Prostate cancer in her father.She reports that she has never smoked. She has never used smokeless tobacco. She reports that she does not drink alcohol and does not use drugs.    ROS Review of Systems  Objective:  BP (!) 150/85   Pulse 68   Temp 97.6 F (36.4 C)   Ht 5' 2 (1.575  m)   Wt 219 lb (99.3 kg)   SpO2 96%   BMI 40.06 kg/m   BP Readings from Last 3 Encounters:  04/27/24 (!) 150/85  03/06/24 (!) 168/84  08/25/23 (!) 154/88    Wt Readings from Last 3 Encounters:  04/27/24 219 lb (99.3 kg)  03/06/24 206 lb (93.4 kg)  08/25/23 209 lb (94.8 kg)     Physical Exam Physical Exam GENERAL: Alert, cooperative, well developed, no acute distress. HEENT: Normocephalic, normal oropharynx, moist mucous membranes. Fluid buildup in ears with tenderness behind ears. CHEST: Clear to auscultation bilaterally, no wheezes, rhonchi, or crackles. CARDIOVASCULAR: Normal heart rate and rhythm, S1  and S2 normal without murmurs. ABDOMEN: Soft, non-tender, non-distended, without organomegaly, normal bowel sounds. EXTREMITIES: No cyanosis or edema. NEUROLOGICAL: Cranial nerves grossly intact, moves all extremities without gross motor or sensory deficit.   Assessment & Plan:  Bilateral otitis media with effusion -     Ambulatory referral to ENT  TMJ (temporomandibular joint disorder)  Essential hypertension  Fibromyalgia  Other orders -     Nabumetone ; Take 2 tablets (1,000 mg total) by mouth 2 (two) times daily. For muscle and joint pain  Dispense: 360 tablet; Refill: 1 -     Cefprozil ; Take 1 tablet (500 mg total) by mouth 2 (two) times daily. For infection. Take all of this medication.  Dispense: 20 tablet; Refill: 0    Assessment and Plan Assessment & Plan Bilateral otitis media with effusion   Fluid buildup in both ears causes pressure sensation without fever. Recent severe ear pain and pressure may relate to TMJ dysfunction. Prescribed an antibiotic for the ear infection and referred to ENT for further evaluation.  Temporomandibular joint dysfunction   Chronic TMJ dysfunction presents with severe stabbing pain in the jaw and ears, worsened by chewing. Symptoms have persisted since a flare in December, with possible bone-on-bone scraping in the joint. Anti-inflammatory medication is the primary treatment. Discussed potential stomach upset with NSAIDs and confirmed normal kidney function to proceed with NSAID prescription. Prescribed NSAID for TMJ pain and referred to ENT for further evaluation.  Pt. Strongly advised to seek a PCP to address BP and fibromyalgia due to her having multiple reactions to multiple medications.      Follow-up: No follow-ups on file.  Butler Der, M.D. "

## 2024-05-11 ENCOUNTER — Institutional Professional Consult (permissible substitution) (INDEPENDENT_AMBULATORY_CARE_PROVIDER_SITE_OTHER): Payer: Self-pay | Admitting: Physician Assistant

## 2024-08-01 ENCOUNTER — Ambulatory Visit: Payer: Self-pay | Admitting: Neurosurgery
